# Patient Record
Sex: Male | Born: 1980 | Race: White | Hispanic: No | Marital: Single | Smoking: Current every day smoker
Health system: Southern US, Community
[De-identification: ages and names within clinical notes are randomized; demographics above are authoritative.]

## PROBLEM LIST (undated history)

## (undated) DIAGNOSIS — F419 Anxiety disorder, unspecified: Secondary | ICD-10-CM

## (undated) DIAGNOSIS — A549 Gonococcal infection, unspecified: Secondary | ICD-10-CM

## (undated) DIAGNOSIS — R51 Headache: Secondary | ICD-10-CM

## (undated) DIAGNOSIS — I4891 Unspecified atrial fibrillation: Secondary | ICD-10-CM

## (undated) DIAGNOSIS — R519 Headache, unspecified: Secondary | ICD-10-CM

## (undated) DIAGNOSIS — F909 Attention-deficit hyperactivity disorder, unspecified type: Secondary | ICD-10-CM

## (undated) DIAGNOSIS — J9383 Other pneumothorax: Secondary | ICD-10-CM

## (undated) DIAGNOSIS — N200 Calculus of kidney: Secondary | ICD-10-CM

## (undated) DIAGNOSIS — K219 Gastro-esophageal reflux disease without esophagitis: Secondary | ICD-10-CM

## (undated) HISTORY — PX: PERCUTANEOUS PINNING PHALANX FRACTURE OF HAND: SUR1027

---
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
## 2001-12-30 ENCOUNTER — Emergency Department (HOSPITAL_COMMUNITY): Admission: EM | Admit: 2001-12-30 | Discharge: 2001-12-30 | Payer: Self-pay

## 2002-06-17 ENCOUNTER — Emergency Department (HOSPITAL_COMMUNITY): Admission: EM | Admit: 2002-06-17 | Discharge: 2002-06-17 | Payer: Self-pay | Admitting: Emergency Medicine

## 2003-04-18 ENCOUNTER — Emergency Department (HOSPITAL_COMMUNITY): Admission: EM | Admit: 2003-04-18 | Discharge: 2003-04-18 | Payer: Self-pay | Admitting: Emergency Medicine

## 2009-05-16 ENCOUNTER — Emergency Department (HOSPITAL_COMMUNITY): Admission: EM | Admit: 2009-05-16 | Discharge: 2009-05-16 | Payer: Self-pay | Admitting: Emergency Medicine

## 2009-06-20 ENCOUNTER — Ambulatory Visit: Payer: Self-pay | Admitting: Internal Medicine

## 2009-06-20 ENCOUNTER — Emergency Department (HOSPITAL_COMMUNITY): Admission: EM | Admit: 2009-06-20 | Discharge: 2009-06-20 | Payer: Self-pay | Admitting: Emergency Medicine

## 2009-07-06 ENCOUNTER — Ambulatory Visit (HOSPITAL_COMMUNITY): Admission: EM | Admit: 2009-07-06 | Discharge: 2009-07-07 | Payer: Self-pay | Admitting: Emergency Medicine

## 2009-07-26 DIAGNOSIS — I4891 Unspecified atrial fibrillation: Secondary | ICD-10-CM | POA: Insufficient documentation

## 2009-07-27 ENCOUNTER — Encounter (INDEPENDENT_AMBULATORY_CARE_PROVIDER_SITE_OTHER): Payer: Self-pay | Admitting: *Deleted

## 2009-07-30 ENCOUNTER — Encounter: Admission: RE | Admit: 2009-07-30 | Discharge: 2009-08-22 | Payer: Self-pay | Admitting: Plastic Surgery

## 2009-08-21 ENCOUNTER — Ambulatory Visit (HOSPITAL_COMMUNITY): Admission: RE | Admit: 2009-08-21 | Discharge: 2009-08-21 | Payer: Self-pay | Admitting: Plastic Surgery

## 2010-06-04 NOTE — Letter (Signed)
Summary: Appointment - Missed  [facility]     [place], [place]    Phone:   Fax:      July 27, 2009 MRN: [id number]   [facility] [address] [place], [place]  [address]   Dear Mr. [facility],  Our records indicate you missed your appointment on 07-27-2009   with  Dr.Bensimhon   It is very important that we reach you to reschedule this appointment. We look forward to participating in your health care needs. Please contact us at the number listed above at your earliest convenience to reschedule this appointment.     Sincerely,   Lorne Skeens  [facility] Scheduling Team

## 2010-07-24 LAB — COMPREHENSIVE METABOLIC PANEL
Albumin: 3.8 g/dL (ref 3.5–5.2)
BUN: 9 mg/dL (ref 6–23)
CO2: 26 mEq/L (ref 19–32)
Calcium: 9.1 mg/dL (ref 8.4–10.5)
Chloride: 105 mEq/L (ref 96–112)
GFR calc non Af Amer: >60 mL/min (ref >60)
Total Protein: 7.3 g/dL (ref 6.0–8.3)

## 2010-07-24 LAB — DIFFERENTIAL
Basophils Absolute: 0 10*3/uL (ref 0.0–0.1)
Eosinophils Absolute: 0.1 10*3/uL (ref 0.0–0.7)
Eosinophils Relative: 2 % (ref 0–5)
Lymphocytes Relative: 33 % (ref 12–46)
Neutrophils Relative %: 59 % (ref 43–77)

## 2010-07-24 LAB — CBC
HCT: 47.3 % (ref 39.0–52.0)
Hemoglobin: 16.2 g/dL (ref 13.0–17.0)
Platelets: 178 10*3/uL (ref 150–400)
WBC: 5.7 10*3/uL (ref 4.0–10.5)

## 2010-07-24 LAB — TSH: TSH: 1.483 u[IU]/mL (ref 0.350–4.500)

## 2010-07-28 LAB — POCT I-STAT 4, (NA,K, GLUC, HGB,HCT)
Glucose, Bld: 83 mg/dL (ref 70–99)
HCT: 41 % (ref 39.0–52.0)
Hemoglobin: 13.9 g/dL (ref 13.0–17.0)
Potassium: 3.5 meq/L (ref 3.5–5.1)
Sodium: 141 meq/L (ref 135–145)

## 2011-01-02 ENCOUNTER — Emergency Department (HOSPITAL_COMMUNITY): Payer: Self-pay

## 2011-01-02 ENCOUNTER — Emergency Department (HOSPITAL_COMMUNITY)
Admission: EM | Admit: 2011-01-02 | Discharge: 2011-01-02 | Disposition: A | Discharge status: Home / Self Care | Payer: Self-pay | Attending: Emergency Medicine | Admitting: Emergency Medicine

## 2011-01-02 DIAGNOSIS — R112 Nausea with vomiting, unspecified: Secondary | ICD-10-CM | POA: Insufficient documentation

## 2011-01-02 DIAGNOSIS — N201 Calculus of ureter: Secondary | ICD-10-CM | POA: Insufficient documentation

## 2011-01-02 DIAGNOSIS — R109 Unspecified abdominal pain: Secondary | ICD-10-CM | POA: Insufficient documentation

## 2011-01-02 LAB — URINALYSIS, ROUTINE W REFLEX MICROSCOPIC
Leukocytes, UA: NEGATIVE
Specific Gravity, Urine: 1.017 (ref 1.005–1.030)
pH: 8 (ref 5.0–8.0)

## 2011-01-02 LAB — POCT I-STAT, CHEM 8
BUN: 6 mg/dL (ref 6–23)
Calcium, Ion: 1.1 mmol/L — ABNORMAL LOW (ref 1.12–1.32)
Creatinine, Ser: 1.1 mg/dL (ref 0.50–1.35)
HCT: 45 % (ref 39.0–52.0)
Hemoglobin: 15.3 g/dL (ref 13.0–17.0)

## 2011-01-02 LAB — URINE MICROSCOPIC-ADD ON

## 2011-05-12 ENCOUNTER — Emergency Department (INDEPENDENT_AMBULATORY_CARE_PROVIDER_SITE_OTHER)
Admission: EM | Admit: 2011-05-12 | Discharge: 2011-05-12 | Disposition: A | Discharge status: Home / Self Care | Payer: Self-pay | Source: Home / Self Care | Attending: Emergency Medicine | Admitting: Emergency Medicine

## 2011-05-12 DIAGNOSIS — N342 Other urethritis: Secondary | ICD-10-CM

## 2011-05-12 HISTORY — DX: Calculus of kidney: N20.0

## 2011-05-12 LAB — HIV ANTIBODY (ROUTINE TESTING W REFLEX): HIV: NONREACTIVE

## 2011-05-12 MED ORDER — AZITHROMYCIN 250 MG PO TABS
1000.0000 mg | ORAL_TABLET | Freq: Once | ORAL | Status: AC
  Administered 2011-05-12: 1000 mg via ORAL

## 2011-05-12 MED ORDER — CEFTRIAXONE SODIUM 250 MG IJ SOLR
250.0000 mg | Freq: Once | INTRAMUSCULAR | Status: AC
  Administered 2011-05-12: 250 mg via INTRAMUSCULAR

## 2011-05-12 MED ORDER — AZITHROMYCIN 250 MG PO TABS
ORAL_TABLET | ORAL | Status: AC
  Filled 2011-05-12: qty 4

## 2011-05-12 MED ORDER — CEFTRIAXONE SODIUM 250 MG IJ SOLR
INTRAMUSCULAR | Status: AC
  Filled 2011-05-12: qty 250

## 2011-05-12 MED ORDER — LIDOCAINE HCL (PF) 1 % IJ SOLN
INTRAMUSCULAR | Status: AC
  Filled 2011-05-12: qty 5

## 2011-05-12 NOTE — ED Provider Notes (Signed)
History     CSN: [id number]  Arrival date & time 05/12/11  1026   First MD Initiated Contact with Patient 05/12/11 1107      Chief Complaint  Patient presents with  . Exposure to STD    (Consider location/radiation/quality/duration/timing/severity/associated sxs/prior treatment) HPI Comments: Pt with 1 week of nonoderous yellow penile discharge, dysuria. C/o painless small "bumps" on tip of penis.   No urgency, frequency,  oderous urine, hematuria, penile itching. No fevers, N/V, abd pain, back pain. No recent abx use. Pt had unprotected intercourse with new male partner prior to sx starting. Does not know if she is sxatic.. STD's a concern today. No h/o gonorrhea chlamydia trichmonoas. No h/o syphilis, herpes, HIV.    Patient is a 31 y.o. male presenting with STD exposure. The history is provided by the patient.  Exposure to STD This is a new problem. The current episode started more than 1 week ago. The problem occurs constantly. Pertinent negatives include no chest pain and no abdominal pain. The symptoms are relieved by nothing. He has tried nothing for the symptoms.    Past Medical History  Diagnosis Date  . Kidney stone     Past Surgical History  Procedure Date  . Hand surgery     History reviewed. No pertinent family history.  History  Substance Use Topics  . Smoking status: Current Everyday Smoker  . Smokeless tobacco: Not on file  . Alcohol Use: Yes      Review of Systems  Constitutional: Negative for fever.  HENT: Negative for sore throat.   Cardiovascular: Negative for chest pain.  Gastrointestinal: Negative for nausea, vomiting and abdominal pain.  Genitourinary: Positive for dysuria, discharge, genital sores and penile pain. Negative for urgency, frequency, hematuria, penile swelling, scrotal swelling and testicular pain.  Musculoskeletal: Negative for arthralgias.  Skin: Negative for rash.  Neurological: Negative for weakness.    Allergies    Review of patient's allergies indicates no known allergies.  Home Medications  No current outpatient prescriptions on file.  BP 137/63  Pulse 62  Temp(Src) 98 F (36.7 C) (Oral)  Resp 18  SpO2 100%  Physical Exam  Nursing note and vitals reviewed. Constitutional: He is oriented to person, place, and time. He appears well-developed and well-nourished.  HENT:  Head: Normocephalic and atraumatic.  Eyes: Conjunctivae and EOM are normal.  Neck: Normal range of motion. Neck supple.  Cardiovascular: Regular rhythm.   Pulmonary/Chest: Effort normal. No respiratory distress.  Abdominal: He exhibits no distension.  Genitourinary: Testes normal. Cremasteric reflex is present. Circumcised. No penile erythema or penile tenderness. Discharge found.       Yellowish watery penile d/c. Painless small papules on corona of penis. no other rash. Pt declined chaperone.   Musculoskeletal: Normal range of motion. He exhibits no edema and no tenderness.  Lymphadenopathy:    He has no cervical adenopathy.       Right: Inguinal adenopathy present.       Left: Inguinal adenopathy present.  Neurological: He is alert and oriented to person, place, and time.  Skin: Skin is warm and dry. No rash noted.  Psychiatric: He has a normal mood and affect. His behavior is normal.    ED Course  Procedures (including critical care time)   Labs Reviewed  GC/CHLAMYDIA PROBE AMP, GENITAL  RPR  HIV ANTIBODY (ROUTINE TESTING)   No results found.   1. Urethritis       MDM  H&P most c/w gonorrhea chlamydia. Painless ulcers  do not think that this is herpes. Sent off GC/chlamydia,  HIV, RPR. Will treat empirically now. Giving ceftriaxone 250 mg IM/azithro 1 gm po. . Advised pt to refrain from sexual contact until he knows lab results, symptoms resolve, and partner(s) are treated. Pt knows he has to come in for HIV results. Pt provided working phone number. Pt agrees.      Luiz Blare, MD 05/12/11  [phone]

## 2011-05-12 NOTE — ED Notes (Signed)
C/o penile discharge, burning with urination, pain with erection and penis painful to touch.  Sx for 1 week.

## 2011-05-14 LAB — GC/CHLAMYDIA PROBE AMP, GENITAL: Chlamydia, DNA Probe: POSITIVE — AB

## 2011-05-15 ENCOUNTER — Telehealth (HOSPITAL_COMMUNITY): Payer: Self-pay | Admitting: *Deleted

## 2011-05-15 NOTE — ED Notes (Signed)
GC pos., Chlamydia pos. Pt. adequately treated with Rocephin 250 mg. IM and Zithromax.  Pt. given results and instructions ( see telephone encounter).  DHHS forms x 2 completed and faxed to the [facility]. Vassie Moselle 05/15/2011

## 2011-06-24 ENCOUNTER — Emergency Department (INDEPENDENT_AMBULATORY_CARE_PROVIDER_SITE_OTHER)
Admission: EM | Admit: 2011-06-24 | Discharge: 2011-06-24 | Disposition: A | Discharge status: Home / Self Care | Payer: Self-pay | Source: Home / Self Care | Attending: Family Medicine | Admitting: Family Medicine

## 2011-06-24 ENCOUNTER — Encounter (HOSPITAL_COMMUNITY): Payer: Self-pay | Admitting: Emergency Medicine

## 2011-06-24 DIAGNOSIS — A64 Unspecified sexually transmitted disease: Secondary | ICD-10-CM

## 2011-06-24 MED ORDER — LIDOCAINE HCL (PF) 1 % IJ SOLN
INTRAMUSCULAR | Status: AC
  Filled 2011-06-24: qty 5

## 2011-06-24 MED ORDER — AZITHROMYCIN 250 MG PO TABS
1000.0000 mg | ORAL_TABLET | Freq: Once | ORAL | Status: AC
  Administered 2011-06-24: 1000 mg via ORAL

## 2011-06-24 MED ORDER — CEFTRIAXONE SODIUM 250 MG IJ SOLR
250.0000 mg | Freq: Once | INTRAMUSCULAR | Status: AC
  Administered 2011-06-24: 250 mg via INTRAMUSCULAR

## 2011-06-24 MED ORDER — CEFTRIAXONE SODIUM 250 MG IJ SOLR
INTRAMUSCULAR | Status: AC
  Filled 2011-06-24: qty 250

## 2011-06-24 MED ORDER — AZITHROMYCIN 250 MG PO TABS
ORAL_TABLET | ORAL | Status: AC
  Filled 2011-06-24: qty 4

## 2011-06-24 NOTE — ED Provider Notes (Signed)
History     CSN: [id number]  Arrival date & time 06/24/11  1048   First MD Initiated Contact with Patient 06/24/11 1145      Chief Complaint  Patient presents with  . Exposure to STD    (Consider location/radiation/quality/duration/timing/severity/associated sxs/prior treatment) HPI Comments: Dylan Hoffman presents for evaluation of penile discharge, dysuria, and painful erection. He reports onset of symptoms 3 days ago. He reports a similar episode 3-4 weeks ago, when he and his girlfriend were both treated for gonorrhea and chlamydia. He reports resolution of symptoms after the first round of treatment. He now questions her fidelity.  Patient is a 31 y.o. male presenting with penile discharge. The history is provided by the patient.  Penile Discharge This is a new problem. The current episode started more than 2 days ago. The problem occurs constantly. The problem has not changed since onset.The symptoms are aggravated by intercourse. The symptoms are relieved by nothing.    Past Medical History  Diagnosis Date  . Kidney stone     Past Surgical History  Procedure Date  . Hand surgery     History reviewed. No pertinent family history.  History  Substance Use Topics  . Smoking status: Current Everyday Smoker  . Smokeless tobacco: Not on file  . Alcohol Use: Yes      Review of Systems  Constitutional: Negative.   HENT: Negative.   Eyes: Negative.   Respiratory: Negative.   Cardiovascular: Negative.   Gastrointestinal: Negative.   Genitourinary: Positive for dysuria and discharge. Negative for scrotal swelling, genital sores and testicular pain.  Musculoskeletal: Negative.   Skin: Negative.   Neurological: Negative.     Allergies  Review of patient's allergies indicates no known allergies.  Home Medications  No current outpatient prescriptions on file.  BP 116/68  Pulse 62  Temp(Src) 98.4 F (36.9 C) (Oral)  Resp 14  SpO2 98%  Physical Exam  Nursing note  and vitals reviewed. Constitutional: He is oriented to person, place, and time. He appears well-developed and well-nourished.  HENT:  Head: Normocephalic and atraumatic.  Eyes: EOM are normal.  Neck: Normal range of motion.  Pulmonary/Chest: Effort normal.  Genitourinary: Testes normal. Circumcised. Discharge found.  Musculoskeletal: Normal range of motion.  Neurological: He is alert and oriented to person, place, and time.  Skin: Skin is warm and dry.  Psychiatric: His behavior is normal.    ED Course  Procedures (including critical care time)   Labs Reviewed  GC/CHLAMYDIA PROBE AMP, GENITAL   No results found.   1. STD (male)       MDM  Treated with ceftriaxone 250 mg IM x 1 and azithromycin 1 gm PO x 1        Richardo Priest, MD 06/24/11 1309

## 2011-06-24 NOTE — ED Notes (Signed)
Offered soda and crackers, declined.

## 2011-06-24 NOTE — ED Notes (Signed)
Patient reports penile burning with urination, painful erection, and penile discharge.

## 2011-06-24 NOTE — Discharge Instructions (Signed)
We have treated you for urethritis; this includes gonococcal as well as non-gonococcal urethritis. We have also treated you for chlamydial infection as well. Return to care should your symptoms not improve, or worsen in any way.

## 2011-06-25 LAB — GC/CHLAMYDIA PROBE AMP, GENITAL: GC Probe Amp, Genital: POSITIVE — AB

## 2011-06-26 ENCOUNTER — Telehealth (HOSPITAL_COMMUNITY): Payer: Self-pay | Admitting: *Deleted

## 2011-06-26 NOTE — ED Notes (Signed)
GC pos., Chlamydia neg. Pt. adequately treated with Rocephin and Zithromax. I called pt. and verified x 2. Pt. given results and told he was adequately treated. Pt. instructed to notify his partner to be treated, no sex for 1 week and to practice safe sex. He can get HIV testing at the [facility] STD clinic.  DHHS form completed and faxed to the [facility]. Vassie Moselle 06/26/2011

## 2011-11-30 ENCOUNTER — Encounter (HOSPITAL_COMMUNITY): Payer: Self-pay | Admitting: *Deleted

## 2011-11-30 ENCOUNTER — Emergency Department (HOSPITAL_COMMUNITY): Payer: Self-pay

## 2011-11-30 ENCOUNTER — Inpatient Hospital Stay (HOSPITAL_COMMUNITY)
Admission: EM | Admit: 2011-11-30 | Discharge: 2011-12-02 | DRG: 153 | Disposition: A | Discharge status: Home / Self Care | Payer: Self-pay | Attending: Internal Medicine | Admitting: Internal Medicine

## 2011-11-30 DIAGNOSIS — D72829 Elevated white blood cell count, unspecified: Secondary | ICD-10-CM | POA: Diagnosis present

## 2011-11-30 DIAGNOSIS — R221 Localized swelling, mass and lump, neck: Secondary | ICD-10-CM

## 2011-11-30 DIAGNOSIS — I4891 Unspecified atrial fibrillation: Secondary | ICD-10-CM

## 2011-11-30 DIAGNOSIS — J029 Acute pharyngitis, unspecified: Principal | ICD-10-CM

## 2011-11-30 DIAGNOSIS — K029 Dental caries, unspecified: Secondary | ICD-10-CM | POA: Diagnosis present

## 2011-11-30 DIAGNOSIS — K006 Disturbances in tooth eruption: Secondary | ICD-10-CM | POA: Diagnosis present

## 2011-11-30 DIAGNOSIS — F17200 Nicotine dependence, unspecified, uncomplicated: Secondary | ICD-10-CM | POA: Diagnosis present

## 2011-11-30 DIAGNOSIS — J329 Chronic sinusitis, unspecified: Secondary | ICD-10-CM

## 2011-11-30 DIAGNOSIS — R220 Localized swelling, mass and lump, head: Secondary | ICD-10-CM

## 2011-11-30 DIAGNOSIS — R591 Generalized enlarged lymph nodes: Secondary | ICD-10-CM

## 2011-11-30 DIAGNOSIS — L0291 Cutaneous abscess, unspecified: Secondary | ICD-10-CM

## 2011-11-30 DIAGNOSIS — L0390 Cellulitis, unspecified: Secondary | ICD-10-CM

## 2011-11-30 LAB — CBC WITH DIFFERENTIAL/PLATELET
Eosinophils Relative: 0 % (ref 0–5)
HCT: 44.2 % (ref 39.0–52.0)
Hemoglobin: 15.3 g/dL (ref 13.0–17.0)
Lymphocytes Relative: 14 % (ref 12–46)
Lymphs Abs: 1.3 10*3/uL (ref 0.7–4.0)
MCV: 91.9 fL (ref 78.0–100.0)
Monocytes Absolute: 0.6 10*3/uL (ref 0.1–1.0)
Monocytes Relative: 6 % (ref 3–12)
RBC: 4.81 MIL/uL (ref 4.22–5.81)
WBC: 9.8 10*3/uL (ref 4.0–10.5)

## 2011-11-30 LAB — COMPREHENSIVE METABOLIC PANEL
ALT: 11 U/L (ref 0–53)
CO2: 26 mEq/L (ref 19–32)
Calcium: 9.6 mg/dL (ref 8.4–10.5)
Chloride: 98 mEq/L (ref 96–112)
Creatinine, Ser: 0.78 mg/dL (ref 0.50–1.35)
GFR calc Af Amer: >90 mL/min (ref >90)
GFR calc non Af Amer: >90 mL/min (ref >90)
Glucose, Bld: 93 mg/dL (ref 70–99)
Total Bilirubin: 1.1 mg/dL (ref 0.3–1.2)

## 2011-11-30 MED ORDER — DEXAMETHASONE SODIUM PHOSPHATE 10 MG/ML IJ SOLN
10.0000 mg | Freq: Three times a day (TID) | INTRAMUSCULAR | Status: DC
  Administered 2011-11-30 – 2011-12-01 (×2): 10 mg via INTRAVENOUS
  Filled 2011-11-30 (×5): qty 1

## 2011-11-30 MED ORDER — SODIUM CHLORIDE 0.9 % IJ SOLN
3.0000 mL | Freq: Two times a day (BID) | INTRAMUSCULAR | Status: DC
  Administered 2011-11-30 – 2011-12-01 (×2): 3 mL via INTRAVENOUS

## 2011-11-30 MED ORDER — CHLORHEXIDINE GLUCONATE 0.12 % MT SOLN
15.0000 mL | Freq: Four times a day (QID) | OROMUCOSAL | Status: DC
  Administered 2011-11-30 – 2011-12-02 (×6): 15 mL via OROMUCOSAL
  Filled 2011-11-30 (×10): qty 15

## 2011-11-30 MED ORDER — HYDROCODONE-ACETAMINOPHEN 5-325 MG PO TABS
1.0000 | ORAL_TABLET | ORAL | Status: DC | PRN
  Administered 2011-12-01 (×3): 2 via ORAL
  Filled 2011-11-30 (×4): qty 2

## 2011-11-30 MED ORDER — IOHEXOL 300 MG/ML  SOLN
80.0000 mL | Freq: Once | INTRAMUSCULAR | Status: AC | PRN
  Administered 2011-11-30: 80 mL via INTRAVENOUS

## 2011-11-30 MED ORDER — SODIUM CHLORIDE 0.9 % IV SOLN
INTRAVENOUS | Status: AC
  Administered 2011-11-30: 22:00:00 via INTRAVENOUS

## 2011-11-30 MED ORDER — HYDROMORPHONE HCL PF 1 MG/ML IJ SOLN
1.0000 mg | INTRAMUSCULAR | Status: AC | PRN
  Administered 2011-11-30: 1 mg via INTRAVENOUS
  Filled 2011-11-30 (×3): qty 1

## 2011-11-30 MED ORDER — SODIUM CHLORIDE 0.9 % IJ SOLN: 3.0000 mL | INTRAMUSCULAR | Status: DC | PRN

## 2011-11-30 MED ORDER — SODIUM CHLORIDE 0.9 % IV BOLUS (SEPSIS)
1000.0000 mL | Freq: Once | INTRAVENOUS | Status: AC
  Administered 2011-11-30: 1000 mL via INTRAVENOUS

## 2011-11-30 MED ORDER — BACID PO TABS
2.0000 | ORAL_TABLET | Freq: Three times a day (TID) | ORAL | Status: DC
  Administered 2011-11-30: 2 via ORAL
  Filled 2011-11-30 (×6): qty 2

## 2011-11-30 MED ORDER — CLINDAMYCIN PHOSPHATE 300 MG/50ML IV SOLN
300.0000 mg | Freq: Four times a day (QID) | INTRAVENOUS | Status: DC
  Administered 2011-11-30 – 2011-12-02 (×6): 300 mg via INTRAVENOUS
  Filled 2011-11-30 (×8): qty 50

## 2011-11-30 MED ORDER — ONDANSETRON HCL 4 MG/2ML IJ SOLN
4.0000 mg | Freq: Three times a day (TID) | INTRAMUSCULAR | Status: AC | PRN
  Administered 2011-11-30: 4 mg via INTRAVENOUS
  Filled 2011-11-30: qty 2

## 2011-11-30 MED ORDER — HYDROMORPHONE HCL PF 1 MG/ML IJ SOLN
1.0000 mg | Freq: Once | INTRAMUSCULAR | Status: AC
  Administered 2011-11-30: 1 mg via INTRAVENOUS
  Filled 2011-11-30: qty 1

## 2011-11-30 MED ORDER — DOXYCYCLINE HYCLATE 100 MG IV SOLR
100.0000 mg | Freq: Two times a day (BID) | INTRAVENOUS | Status: DC
  Administered 2011-11-30 – 2011-12-01 (×2): 100 mg via INTRAVENOUS
  Filled 2011-11-30 (×3): qty 100

## 2011-11-30 MED ORDER — HYDROMORPHONE HCL PF 1 MG/ML IJ SOLN
1.0000 mg | INTRAMUSCULAR | Status: DC | PRN
  Administered 2011-12-01 – 2011-12-02 (×4): 1 mg via INTRAVENOUS
  Filled 2011-11-30 (×2): qty 1

## 2011-11-30 MED ORDER — SODIUM CHLORIDE 0.9 % IV SOLN: 250.0000 mL | INTRAVENOUS | Status: DC | PRN

## 2011-11-30 NOTE — Consult Note (Signed)
Joffre, Lucks [id number] 05/29/1980 Gerhard Munch, MD  Reason for Consult: trismus, sinusitis, left neck lymphadenitis and phlegmon  HPI: 31 yo who has had several days of worsening trismus and left neck pain and swelling. He has had increasing difficulty swallowing his saliva or eating/drinking. I reviewed his Neck CT with contrast, this shows bulky left greater than right bilateral lymphadenopathy with dental caries and impacted third molars and a hypodense left parapharyngeal/left level II neck process extending down to the left supraglottic larynx and hypopharynx consistent with a phlegmon without drainable abscess. He also has left maxillary Lund-McKay grade I sinusitis.  Allergies: No Known Allergies  ROS: + left neck pain and swelling, + trismus, + difficulty handling secretions, otherwise negative x 10 systems except per HPI.  PMH:  Past Medical History  Diagnosis Date  . Kidney stone     FH: History reviewed. No pertinent family history.  SH:  History   Social History  . Marital Status: Single    Spouse Name: N/A    Number of Children: N/A  . Years of Education: N/A   Occupational History  . Not on file.   Social History Main Topics  . Smoking status: Current Everyday Smoker  . Smokeless tobacco: Not on file  . Alcohol Use: Yes  . Drug Use: Yes    Special: Marijuana  . Sexually Active:    Other Topics Concern  . Not on file   Social History Narrative  . No narrative on file    PSH:  Past Surgical History  Procedure Date  . Hand surgery     Physical  Exam: CN 2-12 grossly intact and symmetric. EAC/TMs normal BL. He has trismus to 2cm. The tongue is mobile and normal. The uvula is midline with some mild/minimal edema. The tonsils are 1-2+. Skin warm and dry. Nasal cavity without polyps or purulence. External nose and ears without masses or lesions. EOMI, PERRLA. Neck is tender to palpation and erythematous and edematous on the left. There is bilateral  tender lymphadenopathy palpated. Thyroid normal with no palpable masses. Trachea is midline. No stridor or stertor when lying flat.   A/P: left maxillary sinusitis and pharyngitis with phlegmon of the left neck, pharynx, and floor of mouth. I would recommend admission to medicine/hospitalist service with IV Decadron and IV antibiotics. I have written for Clindamycin and Doxycycline to double cover anaerobes, dental flora, and staph/strep. Monitor for clinical improvement and when trismus and neck swelling have improved would transition to PO Doxycycline and clindamycin for total of 14 days with a PO steroid taper. My suspicion for malignancy is low but he can follow up with me after discharge for re-exam and surveillance of the lymphadenopathy and outpatient FNA if this persists.   Melvenia Beam 11/30/2011 8:08 PM

## 2011-11-30 NOTE — H&P (Signed)
Chief Complaint:  Jaw pain  HPI: 31 yo healthy male comes in with 4 days of progressive worsening left lower jaw pain and neck swelling.  Also with sore throat.  Hard and painful to open mouth.  Denies fevers but has been having chills.  No rashes.  No drainage from ears.  ent already seen pt and recommended hosp for iv abx and steriods.  Pt has not been on any recent abx.  Review of Systems:  O/w neg  Past Medical History: Past Medical History  Diagnosis Date  . Kidney stone    Past Surgical History  Procedure Date  . Hand surgery     Medications: Prior to Admission medications   Not on File    Allergies:  No Known Allergies  Social History:  reports that he has been smoking.  He does not have any smokeless tobacco history on file. He reports that he drinks alcohol. He reports that he uses illicit drugs (Marijuana).  Family History: History reviewed. No pertinent family history.  Physical Exam: Filed Vitals:   11/30/11 1549 11/30/11 1756  BP: 132/75 123/77  Pulse: 71 65  Temp: 98.7 F (37.1 C)   TempSrc: Oral   Resp: 16 18  SpO2: 100% 96%   General appearance: alert, cooperative and no distress Neck: supple, symmetrical, trachea midline and thyroid not enlarged, symmetric, no tenderness/mass/nodules mild swelling left submand area, no cellultiis,  Lungs: clear to auscultation bilaterally Heart: regular rate and rhythm, S1, S2 normal, no murmur, click, rub or gallop Abdomen: soft, non-tender; bowel sounds normal; no masses,  no organomegaly Extremities: extremities normal, atraumatic, no cyanosis or edema Pulses: 2+ and symmetric Skin: Skin color, texture, turgor normal. No rashes or lesions Neurologic: Grossly normal    Labs on Admission:   [facility] 11/30/11 1702  NA 136  K 4.0  CL 98  CO2 26  GLUCOSE 93  BUN 11  CREATININE 0.78  CALCIUM 9.6  MG --  PHOS --    Basename 11/30/11 1702  AST 17  ALT 11  ALKPHOS 48  BILITOT 1.1  PROT 7.7    ALBUMIN 4.2    Basename 11/30/11 1702  WBC 9.8  NEUTROABS 7.8*  HGB 15.3  HCT 44.2  MCV 91.9  PLT 151   Radiological Exams on Admission: Ct Soft Tissue Neck W Contrast  11/30/2011  *RADIOLOGY REPORT*  Clinical Data: Left-sided facial pain and swelling.  CT NECK WITH CONTRAST  Technique:  Multidetector CT imaging of the neck was performed with intravenous contrast.  Contrast: 80mL OMNIPAQUE IOHEXOL 300 MG/ML  SOLN  Comparison: None.  Findings: The visualized portion of the brain appears normal. Examination the skull base demonstrates left maxillary sinus disease.  The mastoid air cells and middle ear cavities are clear.  There is bulky the submandibular and left-sided cervical lymphadenopathy along with a diffuse phlegmonous inflammatory change in the submandibular region and also  tracking down along the anterior aspect of the left neck superficial to the anterior strap muscles.  I do not see a definite discrete abscess.  No obvious dental abscess.  Dental caries are noted.  Impacted wisdom teeth are noted.  No mass or abscess in the floor the mouth.  There is mildly prominent lymphoid tissue at the base the tongue. The epiglottis is normal.  The aryepiglottic folds are normal.  The piriform sinus and vallecular air spaces are normal.  The region of the true cords is normal.  The major vascular structures are patent.  The  thyroid gland is normal.  Small supraclavicular lymph nodes are noted.  The lung apices are clear.  IMPRESSION:  1.  Bulky left-sided submandibular and cervical region lymphadenopathy with a diffuse inflammatory/phlegmonous process involving the left side of the neck without discrete abscess. Findings most likely due to lymphadenitis but recommend ENT consultation to exclude underlying malignancy. 2.  Dental caries and impacted wisdom teeth but no definite bone abscess.  No abscess in the floor of mouth or tonsillar regions.  Original Report Authenticated By: P. Loralie Champagne, M.D.     Assessment/Plan Present on Admission:  31 yo male with left sided neck swelling, pharyngitis, sinusitis no obv absess .Facial swelling .Sinusitis .Pharyngitis  obs for iv doxy/clinda/steroids.  ent following.  Likely d/c in 24 hours or so.  Iv and po pain meds prn.  Edmundo Tedesco [phone] 11/30/2011, 8:30 PM

## 2011-11-30 NOTE — ED Notes (Signed)
PA at bedside.

## 2011-11-30 NOTE — ED Provider Notes (Signed)
History    Scribed for Gerhard Munch, MD, the patient was seen in room TR08C/TR08C. This chart was scribed by Katha Cabal.   CSN: [id number]  Arrival date & time 11/30/11  1530   First MD Initiated Contact with Patient 11/30/11 1614      Chief Complaint  Patient presents with  . Dental Pain    (Consider location/radiation/quality/duration/timing/severity/associated sxs/prior treatment) HPI Gerhard Munch, MD entered patient's room at 4:52 PM   Dylan Hoffman is a 31 y.o. male who presents to the Emergency Department complaining of persistence of constant lower left dental pain for past 3 days.  Pain radiates from eye to neck.  Symptoms are associated with trismus, difficulty swallowing, chills, nausea, night sweats and neck discomfort.  Symptoms are not associated with fever, chest pain, abdominal pain or disorientation. Ibuprofen taken for pain with short term relief.          Past Medical History  Diagnosis Date  . Kidney stone     Past Surgical History  Procedure Date  . Hand surgery     History reviewed. No pertinent family history.  History  Substance Use Topics  . Smoking status: Current Everyday Smoker  . Smokeless tobacco: Not on file  . Alcohol Use: Yes      Review of Systems  All other systems reviewed and are negative.   Remaining review of systems negative except as noted in the HPI.   Allergies  Review of patient's allergies indicates no known allergies.  Home Medications  No current outpatient prescriptions on file.  BP 132/75  Pulse 71  Temp 98.7 F (37.1 C) (Oral)  Resp 16  SpO2 100%  Physical Exam  Nursing note and vitals reviewed. Constitutional: He is oriented to person, place, and time. He appears well-developed. No distress.  HENT:  Head: Normocephalic and atraumatic. There is trismus in the jaw.  Mouth/Throat: Oropharynx is clear and moist.       partial lower left wisdom tooth eruption, good dentition, no signs of  infection appreciated   Eyes: Conjunctivae and EOM are normal.  Cardiovascular: Normal rate and regular rhythm.   Pulmonary/Chest: Effort normal. No stridor. No respiratory distress.  Abdominal: He exhibits no distension.  Musculoskeletal: He exhibits no edema.  Neurological: He is alert and oriented to person, place, and time.  Skin: Skin is warm and dry.  Psychiatric: He has a normal mood and affect.    ED Course  Procedures (including critical care time)     COORDINATION OF CARE: 4:56 PM  Physical exam complete.  Will CT neck soft tissue with contrast.   5:00 PM  IV fluids and Dilaudid ordered.      LABS / RADIOLOGY:    Labs Reviewed  CBC WITH DIFFERENTIAL  COMPREHENSIVE METABOLIC PANEL   No results found.       MDM       MEDICATIONS GIVEN IN THE E.D. Scheduled Meds:    .  HYDROmorphone (DILAUDID) injection  1 mg Intravenous Once  . sodium chloride  1,000 mL Intravenous Once   Continuous Infusions:      IMPRESSION: No diagnosis found.   NEW MEDICATIONS: New Prescriptions   No medications on file    I personally performed the services described in this documentation, which was scribed in my presence. The recorded information has been reviewed and considered.  This previously well male presents with new left facial pain and trismus.  Given the patient's trismus, discomfort, there is suspicion of peritonsillar,  or other abscess.  The patient was transferred to the CDU for the completion of his care.    Gerhard Munch, MD 12/10/11 1549

## 2011-11-30 NOTE — ED Provider Notes (Signed)
6:08 PM Pt is moved to the CDU after initial evaluation by EDP Jeraldine Loots; presented to ED with c/c left lower dental pain. Trismus on exam with no obvious infection seen initially. Labs and CT neck were ordered for further evaluation. On my exam, patient is alert and oriented, in no acute distress. He is able to open his mouth only about 1 cm. Cervical and submandibular lymphadenopathy noted on the left. Lungs are clear to auscultation bilaterally. Heart regular rate and rhythm.  Labs are reviewed and the patient has no leukocytosis, unremarkable metabolic panel. CBC pending. We'll continue to monitor.  7:36 PM CT scan results have been reviewed, discussed with EDP. ENT will be consulted.   7:50 PM ENT (Dr Emeline Darling) evaluated the patient's imaging study, spoke with attending physician. Will place steroid and antibiotic orders and requests a medicine admission. [facility] is paged. Patient is updated on this plan and voices understanding.  Shaaron Adler, PA-C 12/01/11 0000

## 2011-11-30 NOTE — ED Notes (Signed)
Reports having dental abscess left lower, having severe pain and mild swelling. Airway is intact.

## 2011-12-01 ENCOUNTER — Observation Stay (HOSPITAL_COMMUNITY): Payer: Self-pay

## 2011-12-01 DIAGNOSIS — R599 Enlarged lymph nodes, unspecified: Secondary | ICD-10-CM

## 2011-12-01 MED ORDER — NICOTINE 21 MG/24HR TD PT24
21.0000 mg | MEDICATED_PATCH | Freq: Every day | TRANSDERMAL | Status: DC
  Administered 2011-12-01 – 2011-12-02 (×2): 21 mg via TRANSDERMAL
  Filled 2011-12-01 (×2): qty 1

## 2011-12-01 MED ORDER — RISAQUAD PO CAPS
2.0000 | ORAL_CAPSULE | Freq: Three times a day (TID) | ORAL | Status: DC
Start: 2011-12-01 — End: 2011-12-01
  Filled 2011-12-01: qty 2

## 2011-12-01 MED ORDER — RISAQUAD PO CAPS
2.0000 | ORAL_CAPSULE | Freq: Three times a day (TID) | ORAL | Status: DC
  Administered 2011-12-01 – 2011-12-02 (×2): 2 via ORAL
  Filled 2011-12-01 (×4): qty 2

## 2011-12-01 MED ORDER — LACTINEX PO CHEW
2.0000 | CHEWABLE_TABLET | Freq: Three times a day (TID) | ORAL | Status: DC
  Filled 2011-12-01: qty 2

## 2011-12-01 NOTE — ED Provider Notes (Signed)
Please see my initial note.  This young M was placed in CDU pending CT.  I reviewed the results, and discussed them with Dr. Emeline Darling (ENT).  We agreed to start the patient on abx, and admit him to the hospitalists w ENT following.  Gerhard Munch, MD 12/01/11 [phone]

## 2011-12-01 NOTE — Progress Notes (Signed)
Nutrition Brief Note  Patient identified on the Nutrition Risk Report for problems chewing or swallowing. Pt reports he is not able to open his mouth completely so is only able to take in small items. Pt states it has been improving since admission with medication for pain. This problem has been going on for 2 days now. Does not have a problem chewing his food once he gets it in his mouth.   RD does not expect this to significantly impact the pt's weight or nutritional status.    Body mass index is 19.87 kg/(m^2). Pt meets criteria for WNL based on current BMI.   Current diet order is clear liquids, patient is consuming approximately 100% of meals at this time. Labs and medications reviewed.   No nutrition interventions warranted at this time. If nutrition issues arise, please re-consult RD.   Clarene Duke RD, LDN Pager [phone] After Hours pager [phone]

## 2011-12-01 NOTE — Progress Notes (Signed)
[facility] PROGRESS NOTE  Dylan Hoffman ZOX:[id number] DOB: Sep 13, 1980 DOA: 11/30/2011   Assessment/Plan: Patient Active Hospital Problem List: Facial swelling (11/30/2011)   -currently have a leukocytosis, has remained afebrile. He relates the swelling in his neck and mouth is decreased. We'll get an orthopantogram of his mouth to rule out an abscess. We'll continue on the clindamycin DC steroids.  Sinusitis (11/30/2011) -no headaches no complain of some sinus.    Code Status: Full Family Communication: Patient and family Disposition Plan: To be determined     LOS: 1 day   Procedures:    Antibiotics:  Clindamycin 11/22/2011    Subjective: He relates he feels much better. He is able to swallow today. And actually tolerating his diet. He relates no further jaw pain.  Objective: Filed Vitals:   11/30/11 1756 11/30/11 2055 11/30/11 2228 12/01/11 0502  BP: 123/77 121/83 123/77 129/74  Pulse: 65 61 66 63  Temp:   98.9 F (37.2 C) 97.9 F (36.6 C)  TempSrc:   Oral Oral  Resp: 18 18 18 18   Height:   6\' 2"  (1.88 m)   Weight:   70.2 kg (154 lb 12.2 oz)   SpO2: 96% 97% 99% 100%    Intake/Output Summary (Last 24 hours) at 12/01/11 1043 Last data filed at 12/01/11 0900  Gross per 24 hour  Intake 1971.25 ml  Output      0 ml  Net 1971.25 ml   Weight change:   Exam:  General: Alert, awake, oriented x3, in no acute distress.  HEENT: No bruits, no goiter. His neck is swollen on the left side tender lymph nodes and tender lower posterior maxilla Heart: Regular rate and rhythm, without murmurs, rubs, gallops.  Lungs: Good air movement, bilateral air movement.  Abdomen: Soft, nontender, nondistended, positive bowel sounds.  Neuro: Grossly intact, nonfocal.   Data Reviewed: Basic Metabolic Panel:  Lab 11/30/11 8119  NA 136  K 4.0  CL 98  CO2 26  GLUCOSE 93  BUN 11  CREATININE 0.78  CALCIUM 9.6  MG --  PHOS --   Liver Function Tests:  Lab  11/30/11 1702  AST 17  ALT 11  ALKPHOS 48  BILITOT 1.1  PROT 7.7  ALBUMIN 4.2   No results found for this basename: LIPASE:5,AMYLASE:5 in the last 168 hours No results found for this basename: AMMONIA:5 in the last 168 hours CBC:  Lab 11/30/11 1702  WBC 9.8  NEUTROABS 7.8*  HGB 15.3  HCT 44.2  MCV 91.9  PLT 151   Cardiac Enzymes: No results found for this basename: CKTOTAL:5,CKMB:5,CKMBINDEX:5,TROPONINI:5 in the last 168 hours BNP: No components found with this basename: POCBNP:5 CBG: No results found for this basename: GLUCAP:5 in the last 168 hours  No results found for this or any previous visit (from the past 240 hour(s)).   Studies: Ct Soft Tissue Neck W Contrast  11/30/2011  *RADIOLOGY REPORT*  Clinical Data: Left-sided facial pain and swelling.  CT NECK WITH CONTRAST  Technique:  Multidetector CT imaging of the neck was performed with intravenous contrast.  Contrast: 80mL OMNIPAQUE IOHEXOL 300 MG/ML  SOLN  Comparison: None.  Findings: The visualized portion of the brain appears normal. Examination the skull base demonstrates left maxillary sinus disease.  The mastoid air cells and middle ear cavities are clear.  There is bulky the submandibular and left-sided cervical lymphadenopathy along with a diffuse phlegmonous inflammatory change in the submandibular region and also  tracking down along the anterior aspect  of the left neck superficial to the anterior strap muscles.  I do not see a definite discrete abscess.  No obvious dental abscess.  Dental caries are noted.  Impacted wisdom teeth are noted.  No mass or abscess in the floor the mouth.  There is mildly prominent lymphoid tissue at the base the tongue. The epiglottis is normal.  The aryepiglottic folds are normal.  The piriform sinus and vallecular air spaces are normal.  The region of the true cords is normal.  The major vascular structures are patent.  The thyroid gland is normal.  Small supraclavicular lymph nodes are  noted.  The lung apices are clear.  IMPRESSION:  1.  Bulky left-sided submandibular and cervical region lymphadenopathy with a diffuse inflammatory/phlegmonous process involving the left side of the neck without discrete abscess. Findings most likely due to lymphadenitis but recommend ENT consultation to exclude underlying malignancy. 2.  Dental caries and impacted wisdom teeth but no definite bone abscess.  No abscess in the floor of mouth or tonsillar regions.  Original Report Authenticated By: P. Loralie Champagne, M.D.    Scheduled Meds:   . sodium chloride   Intravenous STAT  . chlorhexidine  15 mL Mouth/Throat QID  . clindamycin (CLEOCIN) IV  300 mg Intravenous Q6H  . dexamethasone  10 mg Intravenous Q8H  . doxycycline (VIBRAMYCIN) IV  100 mg Intravenous Q12H  .  HYDROmorphone (DILAUDID) injection  1 mg Intravenous Once  . lactobacillus acidophilus  2 tablet Oral TID  . sodium chloride  1,000 mL Intravenous Once  . sodium chloride  3 mL Intravenous Q12H   Continuous Infusions:   Lambert Keto, MD  [facility] Pager [phone]  If 7PM-7AM, please contact night-coverage [email or web address] Password [facility] 12/01/2011, 10:43 AM

## 2011-12-01 NOTE — Progress Notes (Signed)
Utilization review complete 

## 2011-12-02 MED ORDER — HYDROCODONE-ACETAMINOPHEN 10-325 MG PO TABS: 1.0000 | ORAL_TABLET | Freq: Four times a day (QID) | ORAL | Status: AC | PRN

## 2011-12-02 MED ORDER — CLINDAMYCIN HCL 150 MG PO CAPS: 450.0000 mg | ORAL_CAPSULE | Freq: Three times a day (TID) | ORAL | Status: DC

## 2011-12-02 MED ORDER — CLINDAMYCIN HCL 150 MG PO CAPS: 450.0000 mg | ORAL_CAPSULE | Freq: Three times a day (TID) | ORAL | Status: AC

## 2011-12-02 NOTE — Discharge Summary (Signed)
Physician Discharge Summary  Dylan Hoffman WJX:[id number] DOB: July 11, 1980 DOA: 11/30/2011  PCP: Sheila Oats, MD  Admit date: 11/30/2011 Discharge date: 12/02/2011  Discharge Diagnoses:  Principal Problem:  *Facial swelling Active Problems:  Pharyngitis   Discharge Condition: Stable at the time of discharge  Disposition:  Follow-up Information    Follow up with Florene Route on 12/03/2011. (10:20  [address], please bring all paper work from hospitalization alone with xrays and ct)    Contact information:   [address] [facility] [place] [address] [phone]          Diet: Continue soft diet and advance as tolerated Wt Readings from Last 3 Encounters:  11/30/11 70.2 kg (154 lb 12.2 oz)    History of present illness:  31 yo healthy male comes in with 4 days of progressive worsening left lower jaw pain and neck swelling. Also with sore throat. Hard and painful to open mouth. Denies fevers but has been having chills. No rashes. No drainage from ears. ent already seen pt and recommended hosp for iv abx and steriods. Pt has not been on any recent abx.   Hospital Course:  Principal Problem:  *Facial swelling secondary to molar cavity and impacted right lower wisdom tooth: -The patient was admitted to the hospital because difficulty swallowing. To the point where he was not able to swallow his own screen shows. He was started on empiric antibiotic and a short course of steroids. Next day the patient's swelling was significantly decreased he was able to eat starting with a clear liquid diet. Which has been advanced. He remained afebrile blood cultures continue to be negative. CT scan of the face was done which showed results as above, orthopantogram was done and showed results as above. We'll continue empiric treatment and a followup appointment was set with Dr. Carollee Massed as an outpatient for further intervention.  Discharge Exam: Filed  Vitals:   12/02/11 0551  BP: 117/68  Pulse: 48  Temp: 97.6 F (36.4 C)  Resp: 20   Filed Vitals:   12/01/11 0502 12/01/11 1414 12/01/11 2220 12/02/11 0551  BP: 129/74 108/67 117/66 117/68  Pulse: 63 57 60 48  Temp: 97.9 F (36.6 C) 98.2 F (36.8 C) 98 F (36.7 C) 97.6 F (36.4 C)  TempSrc: Oral Oral Oral Oral  Resp: 18 18 18 20   Height:      Weight:      SpO2: 100% 99% 100% 100%   General: Awake alert and oriented x3 in no acute distress Cardiovascular: Regular rate and rhythm with positive S1 and S2 Respiratory: Good air movement clear to auscultation  Discharge Instructions  Discharge Orders    Future Orders Please Complete By Expires   Diet - low sodium heart healthy      Diet - low sodium heart healthy      Increase activity slowly      Increase activity slowly        Medication List  As of 12/02/2011 10:25 AM   TAKE these medications         clindamycin 150 MG capsule   Commonly known as: CLEOCIN   Take 3 capsules (450 mg total) by mouth 3 (three) times daily.      HYDROcodone-acetaminophen 10-325 MG per tablet   Commonly known as: NORCO   Take 1 tablet by mouth every 6 (six) hours as needed for pain.  The results of significant diagnostics from this hospitalization (including imaging, microbiology, ancillary and laboratory) are listed below for reference.    Significant Diagnostic Studies: Dg Orthopantogram  12/01/2011  *RADIOLOGY REPORT*  Clinical Data: Left posterior upper and lower tooth pain.  ORTHOPANTOGRAM/PANORAMIC  Comparison: None.  Findings: Impacted right lower wisdom tooth.  Moderately large cavity in the left lower second molar.  No other cavities are seen and no periapical lucencies are demonstrated.  IMPRESSION:  1.  Cavity in the left lower second molar. 2.  Impacted right lower wisdom tooth.  Original Report Authenticated By: Darrol Angel, M.D.   Ct Soft Tissue Neck W Contrast  11/30/2011  *RADIOLOGY REPORT*  Clinical  Data: Left-sided facial pain and swelling.  CT NECK WITH CONTRAST  Technique:  Multidetector CT imaging of the neck was performed with intravenous contrast.  Contrast: 80mL OMNIPAQUE IOHEXOL 300 MG/ML  SOLN  Comparison: None.  Findings: The visualized portion of the brain appears normal. Examination the skull base demonstrates left maxillary sinus disease.  The mastoid air cells and middle ear cavities are clear.  There is bulky the submandibular and left-sided cervical lymphadenopathy along with a diffuse phlegmonous inflammatory change in the submandibular region and also  tracking down along the anterior aspect of the left neck superficial to the anterior strap muscles.  I do not see a definite discrete abscess.  No obvious dental abscess.  Dental caries are noted.  Impacted wisdom teeth are noted.  No mass or abscess in the floor the mouth.  There is mildly prominent lymphoid tissue at the base the tongue. The epiglottis is normal.  The aryepiglottic folds are normal.  The piriform sinus and vallecular air spaces are normal.  The region of the true cords is normal.  The major vascular structures are patent.  The thyroid gland is normal.  Small supraclavicular lymph nodes are noted.  The lung apices are clear.  IMPRESSION:  1.  Bulky left-sided submandibular and cervical region lymphadenopathy with a diffuse inflammatory/phlegmonous process involving the left side of the neck without discrete abscess. Findings most likely due to lymphadenitis but recommend ENT consultation to exclude underlying malignancy. 2.  Dental caries and impacted wisdom teeth but no definite bone abscess.  No abscess in the floor of mouth or tonsillar regions.  Original Report Authenticated By: P. Loralie Champagne, M.D.    Microbiology: No results found for this or any previous visit (from the past 240 hour(s)).   Labs: Basic Metabolic Panel:  Lab 11/30/11 4540  NA 136  K 4.0  CL 98  CO2 26  GLUCOSE 93  BUN 11  CREATININE  0.78  CALCIUM 9.6  MG --  PHOS --   Liver Function Tests:  Lab 11/30/11 1702  AST 17  ALT 11  ALKPHOS 48  BILITOT 1.1  PROT 7.7  ALBUMIN 4.2   No results found for this basename: LIPASE:5,AMYLASE:5 in the last 168 hours No results found for this basename: AMMONIA:5 in the last 168 hours CBC:  Lab 11/30/11 1702  WBC 9.8  NEUTROABS 7.8*  HGB 15.3  HCT 44.2  MCV 91.9  PLT 151   Cardiac Enzymes: No results found for this basename: CKTOTAL:5,CKMB:5,CKMBINDEX:5,TROPONINI:5 in the last 168 hours BNP: No components found with this basename: POCBNP:5 CBG: No results found for this basename: GLUCAP:5 in the last 168 hours  Time coordinating discharge: Greater than 30 minutes  Signed:  Marinda Elk  [facility] 12/02/2011, 10:25 AM

## 2011-12-02 NOTE — Care Management Note (Signed)
    Page 1 of 1   12/02/2011     11:00:30 AM   CARE MANAGEMENT NOTE 12/02/2011  Patient:  Dylan Hoffman, Dylan Hoffman   Account Number:  [id number]  Date Initiated:  12/02/2011  Documentation initiated by:  Letha Cape  Subjective/Objective Assessment:   dx facial swelling  admit- lives with parents.  pta independent.     Action/Plan:   Anticipated DC Date:  12/02/2011   Anticipated DC Plan:  HOME/SELF CARE      DC Planning Services  CM consult      Choice offered to / List presented to:             Status of service:  Completed, signed off Medicare Important Message given?   (If response is "NO", the following Medicare IM given date fields will be blank) Date Medicare IM given:   Date Additional Medicare IM given:    Discharge Disposition:  HOME/SELF CARE  Per UR Regulation:  Reviewed for med. necessity/level of care/duration of stay  If discussed at Long Length of Stay Meetings, dates discussed:    Comments:  12/02/11 10:58 Letha Cape RN, BSN [phone] patient lives with parents, pta independent.  Patient has no insurance, patient is eligilbe for med ast , patient has transportation.  Patient states will need help with abx. Sent abx down to main pharmacy , they will call unit when meds are ready.

## 2012-01-14 ENCOUNTER — Emergency Department (HOSPITAL_COMMUNITY)
Admission: EM | Admit: 2012-01-14 | Discharge: 2012-01-14 | Disposition: A | Discharge status: Home / Self Care | Payer: Self-pay | Attending: Emergency Medicine | Admitting: Emergency Medicine

## 2012-01-14 ENCOUNTER — Encounter (HOSPITAL_COMMUNITY): Payer: Self-pay | Admitting: Emergency Medicine

## 2012-01-14 DIAGNOSIS — F17200 Nicotine dependence, unspecified, uncomplicated: Secondary | ICD-10-CM | POA: Insufficient documentation

## 2012-01-14 DIAGNOSIS — I4891 Unspecified atrial fibrillation: Secondary | ICD-10-CM | POA: Diagnosis present

## 2012-01-14 HISTORY — DX: Unspecified atrial fibrillation: I48.91

## 2012-01-14 HISTORY — DX: Anxiety disorder, unspecified: F41.9

## 2012-01-14 HISTORY — DX: Gonococcal infection, unspecified: A54.9

## 2012-01-14 LAB — POCT I-STAT, CHEM 8
BUN: 15 mg/dL (ref 6–23)
Chloride: 102 mEq/L (ref 96–112)
Creatinine, Ser: 1 mg/dL (ref 0.50–1.35)
Sodium: 142 mEq/L (ref 135–145)

## 2012-01-14 LAB — POCT I-STAT TROPONIN I: Troponin i, poc: 0 ng/mL (ref 0.00–0.08)

## 2012-01-14 LAB — MAGNESIUM: Magnesium: 2.1 mg/dL (ref 1.5–2.5)

## 2012-01-14 MED ORDER — METOPROLOL TARTRATE 1 MG/ML IV SOLN
5.0000 mg | INTRAVENOUS | Status: DC | PRN
  Administered 2012-01-14: 5 mg via INTRAVENOUS
  Filled 2012-01-14: qty 5

## 2012-01-14 MED ORDER — PROPOFOL 10 MG/ML IV EMUL
INTRAVENOUS | Status: AC
  Filled 2012-01-14: qty 100

## 2012-01-14 MED ORDER — KETAMINE HCL 10 MG/ML IJ SOLN
1.0000 mg/kg | Freq: Once | INTRAMUSCULAR | Status: AC
  Administered 2012-01-14: 70 mg via INTRAVENOUS
  Filled 2012-01-14: qty 7

## 2012-01-14 MED ORDER — PROPOFOL 10 MG/ML IV BOLUS
0.5000 mg/kg | Freq: Once | INTRAVENOUS | Status: AC
  Administered 2012-01-14: 35 mg via INTRAVENOUS

## 2012-01-14 MED ORDER — POTASSIUM CHLORIDE 10 MEQ/100ML IV SOLN
10.0000 meq | Freq: Once | INTRAVENOUS | Status: AC
  Administered 2012-01-14: 10 meq via INTRAVENOUS
  Filled 2012-01-14: qty 100

## 2012-01-14 NOTE — ED Notes (Signed)
PT. SIGNED CONSENT FORM FOR CARDIOVERSION . CRASH CART AT BEDSIDE / RESPIRATORY THERAPIST NOTIFIED.

## 2012-01-14 NOTE — ED Notes (Signed)
PT. ALERT AND ORIENTED , DENIES ANY PAIN OR DISCOMFORT , RESPIRATIONS UNLABORED , DR. Radford Pax SPOKE WITH PT. ON DISCHARGE PLAN .

## 2012-01-14 NOTE — ED Notes (Signed)
New EKG given to Dr Radford Pax

## 2012-01-14 NOTE — ED Notes (Signed)
ALERT AND ORIENTED , RESPIRATIONS UNLABORED , IV SITE UNREMARKABLE , PT. CALLED FAMILY FOR TRANSPORTATION BACK HOME .

## 2012-01-14 NOTE — Consult Note (Signed)
[facility] History and Physical  DEVAUNTE GASPARINI EAV:[id number] DOB: 29-Jun-1980 DOA: 01/14/2012  Referring physician: Radford Pax PCP: Sheila Oats, MD   Chief Complaint: Afib  HPI: NIL Dylan Hoffman is a 31 y.o. male  26 yr with h/o P Afib in 06/2009 chemically cardioverted with Flecained was fine until 01:00 am 9/11 am when he was eating [employer] and c[employer] and fish sticks and started feeling an irregular beat in his chest.  He started to feel his heart skipping, no dizzyness, no sncope.  He waitd for a little while to see if they would go away, so he came to the ED. This happneed in 2011-but has not happened since.   Drinks muscle monster shakes which apparently contains 156 mg of Caffeine. Smokes Tobacco about 1/2 ppd as well as occ marijuana   Review of Systems: Tno cp/sob/n/v/blurre d/double vision   Past Medical History  Diagnosis Date  . Kidney stone   . Anxiety    Past Surgical History  Procedure Date  . Hand surgery   . Hand surgery    Social History:  reports that he has been smoking Cigarettes.  He has been smoking about 1 pack per day. He does not have any smokeless tobacco history on file. He reports that he uses illicit drugs (Marijuana). He reports that he does not drink alcohol.  History   Social History  . Marital Status: Single    Spouse Name: N/A    Number of Children: N/A  . Years of Education: N/A   Occupational History  . Not on file.   Social History Main Topics  . Smoking status: Current Every Day Smoker -- 1.0 packs/day    Types: Cigarettes  . Smokeless tobacco: Not on file  . Alcohol Use: No  . Drug Use: Yes    Special: Marijuana  . Sexually Active:    Other Topics Concern  . 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   Prior to Admission medications   Not on File   Physical Exam: Filed Vitals:   01/14/12 0204 01/14/12 0404  BP:  128/66 105/72  Pulse: 120 92  Temp: 97.9 F (36.6 C)   TempSrc: Oral   Resp: 18 14  SpO2: 97% 97%     General:  Alert, oriented CM in NAd  Eyes: no pallorict  ENT: nad  Neck: soft, nt/Nt  Cardiovascular:  s1 s2 irreg,irreg  Respiratory: cta b  Abdomen: soft, nt/nd  Skin: tattos on neck, back, forearms  Musculoskeletal: normal  Psychiatric: euthymic  Labs on Admission:  Basic Metabolic Panel:  Lab 01/14/12 7829  NA 142  K 3.2*  CL 102  CO2 --  GLUCOSE 131*  BUN 15  CREATININE 1.00  CALCIUM --  MG --  PHOS --   Liver Function Tests: No results found for this basename: AST:5,ALT:5,ALKPHOS:5,BILITOT:5,PROT:5,ALBUMIN:5 in the last 168 hours No results found for this basename: LIPASE:5,AMYLASE:5 in the last 168 hours No results found for this basename: AMMONIA:5 in the last 168 hours CBC:  Lab 01/14/12 0339  WBC --  NEUTROABS --  HGB 14.3  HCT 42.0  MCV --  PLT --   Cardiac Enzymes: No results found for this basename: CKTOTAL:5,CKMB:5,CKMBINDEX:5,TROPONINI:5 in the last 168 hours  BNP (last 3 results) No results found for this basename: PROBNP:3 in the last 8760 hours  CBG: No results found for this basename: GLUCAP:5 in the last 168 hours  Radiological Exams on Admission: No results found.  EKG: Independently reviewed. Afib c RVR, , QRS axis=0 degrees, J point elevation V4-V6, likely rate related P wave peak.  Assessment/Plan Active Problems:  ATRIAL FIBRILLATION c RVR   1. Case discussed c Dr. 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 Radford Pax and this broke his Afib--Will forward this note to Dr.  Milas Kocher who has een him in the past.  Code Status: FUll Family Communication: none at bedside  Disposition Plan: d/c home with close Cards f/u-thank you for the interesting consult  Time spent: 1 hour   Mahala Menghini [facility] Pager [phone]  If 7PM-7AM, please contact night-coverage [email or web address] Password [facility] 01/14/2012, 5:17 AM

## 2012-01-14 NOTE — ED Notes (Signed)
PT. RESPONSIVE TO VERBAL STIMULI , RESTING AT THIS TIME ,, RESPIRATIONS UNLABORED , IV SITE UNREMARKABLE , HR= 68 NSR.

## 2012-01-14 NOTE — ED Notes (Signed)
PT. CARDIOVERTED AT 50 JOULES , RECEIVED PROPOFOL 70 MG/KETAMINE 70 MG MIXTURE AS SEDATION , CONVERTED TO NSR.  DR. Radford Pax AT BEDSIDE DURING PROCEDURE.

## 2012-01-14 NOTE — ED Notes (Signed)
PT. REPORTS PALPITATIONS ONSET THIS EVENING WITH SLIGHT CHEST TIGHTNESS , DENIES SOB .

## 2012-01-14 NOTE — ED Provider Notes (Signed)
History     CSN: [id number]  Arrival date & time 01/14/12  0145   First MD Initiated Contact with Patient 01/14/12 0259      Chief Complaint  Patient presents with  . Palpitations     HPI Patient complains of palpitations started around 2-3 hours ago.  Patient's had similar episodes in the past with a history of atrial fibrillation.  Patient states the last time he came in here and they gave him some medicine and sent him home.  Patient's not seen a cardiologist for this complaint.  Patient denies any history of chest pain.  Denies drug abuse other than occasional marijuana.  Patient denies any other medical conditions and is on no current regular medications. Past Medical History  Diagnosis Date  . Kidney stone   . Anxiety   . Gonorrhea   . Afib     Past Surgical History  Procedure Date  . Hand surgery   . Hand surgery     Family History  Problem Relation Age of Onset  . Family history unknown: Yes    History  Substance Use Topics  . Smoking status: Current Every Day Smoker -- 1.0 packs/day    Types: Cigarettes  . Smokeless tobacco: Not on file  . Alcohol Use: No      Review of Systems  All other systems reviewed and are negative.    Allergies  Review of patient's allergies indicates no known allergies.  Home Medications  No current outpatient prescriptions on file.  BP 125/83  Pulse 72  Temp 97.9 F (36.6 C) (Oral)  Resp 14  Wt 154 lb 5.2 oz (70 kg)  SpO2 98%  Physical Exam  Nursing note and vitals reviewed. Constitutional: He is oriented to person, place, and time. He appears well-developed. No distress.  HENT:  Head: Normocephalic and atraumatic.  Eyes: Pupils are equal, round, and reactive to light.  Neck: Normal range of motion.  Cardiovascular: Normal rate and intact distal pulses.  An irregularly irregular rhythm present.       Atrial fibrillation Rate = 101 No significant ST-T changes No significant PVCs.  Pulmonary/Chest: No  respiratory distress.  Abdominal: Normal appearance. He exhibits no distension.  Musculoskeletal: Normal range of motion.  Neurological: He is alert and oriented to person, place, and time. No cranial nerve deficit.  Skin: Skin is warm and dry. No rash noted.  Psychiatric: He has a normal mood and affect. His behavior is normal.    ED Course  CARDIOVERSION Date/Time: 01/14/2012 6:20 AM Performed by: Nelia Shi Authorized by: Nelia Shi Consent: Verbal consent obtained. Written consent obtained. Risks and benefits: risks, benefits and alternatives were discussed Consent given by: patient Patient understanding: patient states understanding of the procedure being performed Patient identity confirmed: verbally with patient and arm band Time out: Immediately prior to procedure a "time out" was called to verify the correct patient, procedure, equipment, support staff and site/side marked as required. Patient sedated: yes Sedation type: moderate (conscious) sedation Sedatives: ketamine and propofol Vitals: Vital signs were monitored during sedation. Cardioversion basis: elective Indications: failure of anti-arrhythmic medications Pre-procedure rhythm: atrial fibrillation Patient position: patient was placed in a supine position Chest area: chest area exposed Electrodes: pads Electrodes placed: anterior-posterior Number of attempts: 1 Attempt 1 mode: synchronous Attempt 1 waveform: biphasic Attempt 1 shock (in Joules): 50 Attempt 1 outcome: conversion to normal sinus rhythm Post-procedure rhythm: normal sinus rhythm Complications: no complications Patient tolerance: Patient tolerated the procedure well  with no immediate complications.  Procedural sedation Performed by: Nelia Shi Authorized by: Nelia Shi Consent: Verbal consent obtained. Risks and benefits: risks, benefits and alternatives were discussed Consent given by: patient Patient understanding:  patient states understanding of the procedure being performed Patient identity confirmed: verbally with patient Time out: Immediately prior to procedure a "time out" was called to verify the correct patient, procedure, equipment, support staff and site/side marked as required. Patient sedated: yes Sedation type: moderate (conscious) sedation Sedatives: etomidate and ketamine Vitals: Vital signs were monitored during sedation. Patient tolerance: Patient tolerated the procedure well with no immediate complications.   (including critical care time)  Labs Reviewed  POCT I-STAT, CHEM 8 - Abnormal; Notable for the following:    Potassium 3.2 (*)     Glucose, Bld 131 (*)     All other components within normal limits  POCT I-STAT TROPONIN I  MAGNESIUM   No results found.   1. Atrial fibrillation       MDM          Nelia Shi, MD 01/14/12 [phone]

## 2012-06-19 ENCOUNTER — Emergency Department (HOSPITAL_COMMUNITY): Payer: Self-pay

## 2012-06-19 ENCOUNTER — Observation Stay (HOSPITAL_COMMUNITY)
Admission: EM | Admit: 2012-06-19 | Discharge: 2012-06-20 | Disposition: A | Discharge status: Home / Self Care | Payer: Self-pay | Attending: Cardiovascular Disease | Admitting: Cardiovascular Disease

## 2012-06-19 ENCOUNTER — Encounter (HOSPITAL_COMMUNITY): Payer: Self-pay | Admitting: *Deleted

## 2012-06-19 DIAGNOSIS — R42 Dizziness and giddiness: Secondary | ICD-10-CM | POA: Insufficient documentation

## 2012-06-19 DIAGNOSIS — R0602 Shortness of breath: Secondary | ICD-10-CM | POA: Insufficient documentation

## 2012-06-19 DIAGNOSIS — F411 Generalized anxiety disorder: Secondary | ICD-10-CM | POA: Insufficient documentation

## 2012-06-19 DIAGNOSIS — E876 Hypokalemia: Secondary | ICD-10-CM | POA: Insufficient documentation

## 2012-06-19 DIAGNOSIS — I4891 Unspecified atrial fibrillation: Principal | ICD-10-CM | POA: Insufficient documentation

## 2012-06-19 DIAGNOSIS — F17200 Nicotine dependence, unspecified, uncomplicated: Secondary | ICD-10-CM | POA: Insufficient documentation

## 2012-06-19 LAB — CBC WITH DIFFERENTIAL/PLATELET
Eosinophils Absolute: 0.2 10*3/uL (ref 0.0–0.7)
Lymphocytes Relative: 46 % (ref 12–46)
Lymphs Abs: 2.6 10*3/uL (ref 0.7–4.0)
Neutro Abs: 2.3 10*3/uL (ref 1.7–7.7)
Neutrophils Relative %: 42 % — ABNORMAL LOW (ref 43–77)
Platelets: 195 10*3/uL (ref 150–400)
RBC: 4.94 MIL/uL (ref 4.22–5.81)
WBC: 5.5 10*3/uL (ref 4.0–10.5)

## 2012-06-19 LAB — CBC
MCH: 30.7 pg (ref 26.0–34.0)
MCV: 88.6 fL (ref 78.0–100.0)
Platelets: 165 10*3/uL (ref 150–400)
RBC: 4.63 MIL/uL (ref 4.22–5.81)
RDW: 12.7 % (ref 11.5–15.5)
WBC: 5.9 10*3/uL (ref 4.0–10.5)

## 2012-06-19 LAB — HEPARIN LEVEL (UNFRACTIONATED)
Heparin Unfractionated: 0.72 IU/mL — ABNORMAL HIGH (ref 0.30–0.70)
Heparin Unfractionated: 0.65 IU/mL (ref 0.30–0.70)

## 2012-06-19 LAB — POCT I-STAT, CHEM 8
Chloride: 103 mEq/L (ref 96–112)
Creatinine, Ser: 0.9 mg/dL (ref 0.50–1.35)
Hemoglobin: 15.3 g/dL (ref 13.0–17.0)
Potassium: 3.2 mEq/L — ABNORMAL LOW (ref 3.5–5.1)
Sodium: 139 mEq/L (ref 135–145)

## 2012-06-19 LAB — CREATININE, SERUM: Creatinine, Ser: 0.8 mg/dL (ref 0.50–1.35)

## 2012-06-19 LAB — POCT I-STAT TROPONIN I: Troponin i, poc: 0.01 ng/mL (ref 0.00–0.08)

## 2012-06-19 MED ORDER — HEPARIN (PORCINE) IN NACL 100-0.45 UNIT/ML-% IJ SOLN
1200.0000 [IU]/h | INTRAMUSCULAR | Status: DC
  Administered 2012-06-19: 1300 [IU]/h via INTRAVENOUS
  Administered 2012-06-20: 1200 [IU]/h via INTRAVENOUS
  Filled 2012-06-19 (×3): qty 250

## 2012-06-19 MED ORDER — ASPIRIN EC 81 MG PO TBEC
81.0000 mg | DELAYED_RELEASE_TABLET | Freq: Every day | ORAL | Status: DC
  Administered 2012-06-19 – 2012-06-20 (×2): 81 mg via ORAL
  Filled 2012-06-19 (×2): qty 1

## 2012-06-19 MED ORDER — ONDANSETRON HCL 4 MG/2ML IJ SOLN: 4.0000 mg | Freq: Four times a day (QID) | INTRAMUSCULAR | Status: DC | PRN

## 2012-06-19 MED ORDER — ONDANSETRON HCL 4 MG PO TABS: 4.0000 mg | ORAL_TABLET | Freq: Four times a day (QID) | ORAL | Status: DC | PRN

## 2012-06-19 MED ORDER — DIGOXIN 0.25 MG/ML IJ SOLN
0.2500 mg | Freq: Every day | INTRAMUSCULAR | Status: AC
  Administered 2012-06-19: 0.25 mg via INTRAVENOUS
  Filled 2012-06-19: qty 1

## 2012-06-19 MED ORDER — DILTIAZEM HCL 25 MG/5ML IV SOLN
10.0000 mg | Freq: Once | INTRAVENOUS | Status: AC
  Administered 2012-06-19: 10 mg via INTRAVENOUS
  Filled 2012-06-19: qty 5

## 2012-06-19 MED ORDER — HEPARIN BOLUS VIA INFUSION
5000.0000 [IU] | Freq: Once | INTRAVENOUS | Status: AC
  Administered 2012-06-19: 5000 [IU] via INTRAVENOUS

## 2012-06-19 MED ORDER — HEPARIN SODIUM (PORCINE) 5000 UNIT/ML IJ SOLN: 5000.0000 [IU] | Freq: Three times a day (TID) | INTRAMUSCULAR | Status: DC

## 2012-06-19 MED ORDER — SODIUM CHLORIDE 0.9 % IV SOLN
INTRAVENOUS | Status: DC
  Administered 2012-06-19 (×2): via INTRAVENOUS

## 2012-06-19 MED ORDER — ALUM & MAG HYDROXIDE-SIMETH 200-200-20 MG/5ML PO SUSP: 30.0000 mL | Freq: Four times a day (QID) | ORAL | Status: DC | PRN

## 2012-06-19 MED ORDER — ACETAMINOPHEN 650 MG RE SUPP: 650.0000 mg | Freq: Four times a day (QID) | RECTAL | Status: DC | PRN

## 2012-06-19 MED ORDER — POTASSIUM CHLORIDE CRYS ER 20 MEQ PO TBCR
40.0000 meq | EXTENDED_RELEASE_TABLET | Freq: Once | ORAL | Status: AC
  Administered 2012-06-19: 40 meq via ORAL
  Filled 2012-06-19: qty 2

## 2012-06-19 MED ORDER — ACETAMINOPHEN 325 MG PO TABS: 650.0000 mg | ORAL_TABLET | Freq: Four times a day (QID) | ORAL | Status: DC | PRN

## 2012-06-19 MED ORDER — SODIUM CHLORIDE 0.9 % IV BOLUS (SEPSIS)
500.0000 mL | Freq: Once | INTRAVENOUS | Status: AC
  Administered 2012-06-19: 500 mL via INTRAVENOUS

## 2012-06-19 MED ORDER — SODIUM CHLORIDE 0.9 % IJ SOLN
3.0000 mL | Freq: Two times a day (BID) | INTRAMUSCULAR | Status: DC
  Administered 2012-06-19: 3 mL via INTRAVENOUS

## 2012-06-19 MED ORDER — DILTIAZEM HCL 30 MG PO TABS
30.0000 mg | ORAL_TABLET | Freq: Four times a day (QID) | ORAL | Status: DC
  Administered 2012-06-19 – 2012-06-20 (×5): 30 mg via ORAL
  Filled 2012-06-19 (×9): qty 1

## 2012-06-19 NOTE — Progress Notes (Signed)
  Echocardiogram 2D Echocardiogram has been performed.  Ellender Hose A 06/19/2012, 2:13 PM

## 2012-06-19 NOTE — H&P (Signed)
Dylan Hoffman is an 32 y.o. male.   Chief Complaint: Palpitation and shortness of breath. HPI: 32 years old male presented with palpitations and shortness of breath. No chest pain. + dizziness. No nausea, vomiting or sweating spell. Palpitation started post choking feeling and coughing about 4 in AM. Monitor in ED showed atrial fibrillation with RVR. Heart rate decreased with IV diltiazem use. Had similar episode in September but had no cardiology follow up.  Past Medical History  Diagnosis Date  . Kidney stone   . Anxiety   . Gonorrhea   . Afib       Past Surgical History  Procedure Laterality Date  . Hand surgery    . Hand surgery      History reviewed. No pertinent family history. Social History:  reports that he has been smoking Cigarettes.  He has been smoking about 1.00 pack per day. He does not have any smokeless tobacco history on file. He reports that he uses illicit drugs (Marijuana). He reports that he does not drink alcohol.  Allergies: No Known Allergies   (Not in a hospital admission)  Results for orders placed during the hospital encounter of 06/19/12 (from the past 48 hour(s))  CBC WITH DIFFERENTIAL     Status: Abnormal   Collection Time    06/19/12  5:49 AM      Result Value Range   WBC 5.5  4.0 - 10.5 K/uL   RBC 4.94  4.22 - 5.81 MIL/uL   Hemoglobin 15.5  13.0 - 17.0 g/dL   HCT 40.9  81.1 - 91.4 %   MCV 88.1  78.0 - 100.0 fL   MCH 31.4  26.0 - 34.0 pg   MCHC 35.6  30.0 - 36.0 g/dL   RDW 78.2  95.6 - 21.3 %   Platelets 195  150 - 400 K/uL   Neutrophils Relative 42 (*) 43 - 77 %   Neutro Abs 2.3  1.7 - 7.7 K/uL   Lymphocytes Relative 46  12 - 46 %   Lymphs Abs 2.6  0.7 - 4.0 K/uL   Monocytes Relative 8  3 - 12 %   Monocytes Absolute 0.4  0.1 - 1.0 K/uL   Eosinophils Relative 3  0 - 5 %   Eosinophils Absolute 0.2  0.0 - 0.7 K/uL   Basophils Relative 1  0 - 1 %   Basophils Absolute 0.0  0.0 - 0.1 K/uL  POCT I-STAT TROPONIN I     Status: None   Collection Time    06/19/12  5:53 AM      Result Value Range   Troponin i, poc 0.01  0.00 - 0.08 ng/mL   Comment 3            Comment: Due to the release kinetics of cTnI,     a negative result within the first hours     of the onset of symptoms does not rule out     myocardial infarction with certainty.     If myocardial infarction is still suspected,     repeat the test at appropriate intervals.  POCT I-STAT, CHEM 8     Status: Abnormal   Collection Time    06/19/12  5:55 AM      Result Value Range   Sodium 139  135 - 145 mEq/L   Potassium 3.2 (*) 3.5 - 5.1 mEq/L   Chloride 103  96 - 112 mEq/L   BUN 11  6 - 23 mg/dL  Creatinine, Ser 0.90  0.50 - 1.35 mg/dL   Glucose, Bld 161 (*) 70 - 99 mg/dL   Calcium, Ion 0.96  0.45 - 1.23 mmol/L   TCO2 25  0 - 100 mmol/L   Hemoglobin 15.3  13.0 - 17.0 g/dL   HCT 40.9  81.1 - 91.4 %   Dg Chest Port 1 View  06/19/2012  *RADIOLOGY REPORT*  Clinical Data: Palpitations; irregular heartbeat.  Shortness of breath.  PORTABLE CHEST - 1 VIEW  Comparison: None.  Findings: The lungs are mildly hyperexpanded and appear clear. There is no evidence of focal opacification, pleural effusion or pneumothorax.  The cardiomediastinal silhouette is within normal limits.  No acute osseous abnormalities are seen.  IMPRESSION: Mildly hyperexpanded and clear lungs.   Original Report Authenticated By: Tonia Ghent, M.D.     @ROS @ Review of Systems  Constitutional: Negative for diaphoresis.  HENT: Negative for congestion and neck pain.  Eyes: Negative for visual disturbance.  Respiratory: Positive for shortness of breath. Negative for cough.  Cardiovascular: Positive for palpitations. Negative for chest pain and leg swelling.  Gastrointestinal: Negative for nausea, vomiting and abdominal pain.  Genitourinary: Negative for dysuria.  Musculoskeletal: Negative for back pain.  Skin: Negative for rash.  Neurological: Negative for dizziness, syncope and  light-headedness.  Hematological: Does not bruise/bleed easily.   Blood pressure 95/65, pulse 103, temperature 98.7 F (37.1 C), temperature source Oral, resp. rate 18, SpO2 100.00%. Constitutional: He appears well-developed and well-nourished. No distress.  HENT: Head: Normocephalic and atraumatic. Eyes: Conjunctivae are normal. Pupils are equal, round, and reactive to light.  Neck: Normal range of motion. Neck supple.  Cardiovascular: Intact distal pulses. No murmur heard. Irregular tachycardic rhythm.  Pulmonary/Chest: Effort normal and breath sounds normal. No respiratory distress.   Abdominal: Soft. Bowel sounds are normal. There is no tenderness.  Musculoskeletal: Normal range of motion. He exhibits no edema and no tenderness.  Neurological: He is alert and oriented to person, place, and time. No cranial nerve deficit. He exhibits normal muscle tone. Coordination normal.  Skin: Skin is warm. No erythema.   EKG-Atrial fibrillation with rapid ventricular response.  Assessment/Plan Atrial fibrillation with RVR. Dizziness  Place in observation Consider TEE with cardioversion.  [name] 06/19/2012, 8:35 AM

## 2012-06-19 NOTE — ED Notes (Signed)
HR 78-103 afib

## 2012-06-19 NOTE — ED Notes (Signed)
Denies pain, resting comfortably, "still feels funny, irregular HR remains", VSS, sBP 99, NAD, calm, pending EDP eval.

## 2012-06-19 NOTE — ED Notes (Signed)
Woke at 0400 to get something to eat, "had difficulty swallowing food and felt that that triggered fast irregular HR at that time", also palpitations & mild sob, h/o same, "has been shocked out of rhythm in the past", no meds PTA or regularly, (denies: pain, syncope, dizziness, nv or other sx).

## 2012-06-19 NOTE — Progress Notes (Signed)
ANTICOAGULATION CONSULT NOTE - Initial Consult  Pharmacy Consult for Heparin Indication: atrial fibrillation  No Known Allergies  Patient Measurements: Height: 6\' 3"  (190.5 cm) Weight: 160 lb (72.576 kg) IBW/kg (Calculated) : 84.5 Heparin Dosing Weight: 84 kg Vital Signs: Temp: 98.7 F (37.1 C) (02/15 0620) Temp src: Oral (02/15 0620) BP: 99/62 mmHg (02/15 0836) Pulse Rate: 86 (02/15 0836)  Labs:  Recent Labs  06/19/12 0549 06/19/12 0555  HGB 15.5 15.3  HCT 43.5 45.0  PLT 195  --   CREATININE  --  0.90    The CrCl is unknown because both a height and weight (above a minimum accepted value) are required for this calculation.   Medical History: Past Medical History  Diagnosis Date  . Kidney stone   . Anxiety   . Gonorrhea   . Afib     Medications:   (Not in a hospital admission) No home medications  Assessment: 32 yo M with a history of paroxysmal afib admitted 06/20/11 with palpitations found to be in afib with RVR.  Pharmacy consulted to dose heparin.  Patient denies recent bleeding.  Goal of Therapy:  Heparin level 0.3-0.7 units/ml Monitor platelets by anticoagulation protocol: Yes   Plan:  Give 5000 units bolus x 1 Start heparin infusion at 1300 units/hr Check anti-Xa level in 6 hours and daily while on heparin Continue to monitor H&H and platelets   Thank you for allowing pharmacy to be a part of this patients care team.  Lovenia Kim Pharm.D., BCPS Clinical Pharmacist 06/19/2012 9:03 AM Pager: [phone] Phone: [phone]

## 2012-06-19 NOTE — ED Notes (Signed)
EDP in to room. HR 99 irregular.

## 2012-06-19 NOTE — ED Provider Notes (Addendum)
History     CSN: [id number]  Arrival date & time 06/19/12  0505   First MD Initiated Contact with Patient 06/19/12 [phone]      Chief Complaint  Patient presents with  . Palpitations  . Irregular Heart Beat  . Shortness of Breath    (Consider location/radiation/quality/duration/timing/severity/associated sxs/prior treatment) Patient is a 32 y.o. male presenting with palpitations and shortness of breath. The history is provided by the patient.  Palpitations  Associated symptoms include shortness of breath. Pertinent negatives include no diaphoresis, no chest pain, no abdominal pain, no nausea, no vomiting, no back pain, no dizziness and no cough.  Shortness of Breath Associated symptoms: no abdominal pain, no chest pain, no cough, no diaphoresis, no neck pain, no rash and no vomiting    Patient presents with rapid heart rate and palpitations. States started about 4 in the morning and is eating a snack any kind of choked and coughed and then he started feeling regular heartbeat in that he got faster. Patient felt that it was very fast at home. Associated with some mild shortness of breath no chest pain. Patient's had a history of atrial fib at times not currently on any medications. Last episode was in September which required cardioversion at that time he was not admitted. They were unable to control the heart rate with medications. Patient has never followed up with cardiology. Has never had an outpatient workup.  Past Medical History  Diagnosis Date  . Kidney stone   . Anxiety   . Gonorrhea   . Afib     Past Surgical History  Procedure Laterality Date  . Hand surgery    . Hand surgery      History reviewed. No pertinent family history.  History  Substance Use Topics  . Smoking status: Current Every Day Smoker -- 1.00 packs/day    Types: Cigarettes  . Smokeless tobacco: Not on file  . Alcohol Use: No      Review of Systems  Constitutional: Negative for diaphoresis.   HENT: Negative for congestion and neck pain.   Eyes: Negative for visual disturbance.  Respiratory: Positive for shortness of breath. Negative for cough.   Cardiovascular: Positive for palpitations. Negative for chest pain and leg swelling.  Gastrointestinal: Negative for nausea, vomiting and abdominal pain.  Genitourinary: Negative for dysuria.  Musculoskeletal: Negative for back pain.  Skin: Negative for rash.  Neurological: Negative for dizziness, syncope and light-headedness.  Hematological: Does not bruise/bleed easily.    Allergies  Review of patient's allergies indicates no known allergies.  Home Medications  No current outpatient prescriptions on file.  BP 95/65  Pulse 103  Temp(Src) 98.7 F (37.1 C) (Oral)  Resp 18  SpO2 100%  Physical Exam  Vitals reviewed. Constitutional: He is oriented to person, place, and time. He appears well-developed and well-nourished. No distress.  HENT:  Head: Normocephalic and atraumatic.  Left Ear: External ear normal.  Eyes: Conjunctivae are normal. Pupils are equal, round, and reactive to light.  Neck: Normal range of motion. Neck supple.  Cardiovascular: Intact distal pulses.   No murmur heard. Irregular tachycardic rhythm.  Pulmonary/Chest: Effort normal and breath sounds normal. No respiratory distress. He has no wheezes. He has no rales.  Abdominal: Soft. Bowel sounds are normal. There is no tenderness.  Musculoskeletal: Normal range of motion. He exhibits no edema and no tenderness.  Neurological: He is alert and oriented to person, place, and time. No cranial nerve deficit. He exhibits normal muscle tone. Coordination  normal.  Skin: Skin is warm. No erythema.    ED Course  Procedures (including critical care time)  Labs Reviewed  CBC WITH DIFFERENTIAL - Abnormal; Notable for the following:    Neutrophils Relative 42 (*)    All other components within normal limits  POCT I-STAT, CHEM 8 - Abnormal; Notable for the  following:    Potassium 3.2 (*)    Glucose, Bld 128 (*)    All other components within normal limits  POCT I-STAT TROPONIN I   Dg Chest Port 1 View  06/19/2012  *RADIOLOGY REPORT*  Clinical Data: Palpitations; irregular heartbeat.  Shortness of breath.  PORTABLE CHEST - 1 VIEW  Comparison: None.  Findings: The lungs are mildly hyperexpanded and appear clear. There is no evidence of focal opacification, pleural effusion or pneumothorax.  The cardiomediastinal silhouette is within normal limits.  No acute osseous abnormalities are seen.  IMPRESSION: Mildly hyperexpanded and clear lungs.   Original Report Authenticated By: Tonia Ghent, M.D.    Results for orders placed during the hospital encounter of 06/19/12  CBC WITH DIFFERENTIAL      Result Value Range   WBC 5.5  4.0 - 10.5 K/uL   RBC 4.94  4.22 - 5.81 MIL/uL   Hemoglobin 15.5  13.0 - 17.0 g/dL   HCT 16.1  09.6 - 04.5 %   MCV 88.1  78.0 - 100.0 fL   MCH 31.4  26.0 - 34.0 pg   MCHC 35.6  30.0 - 36.0 g/dL   RDW 40.9  81.1 - 91.4 %   Platelets 195  150 - 400 K/uL   Neutrophils Relative 42 (*) 43 - 77 %   Neutro Abs 2.3  1.7 - 7.7 K/uL   Lymphocytes Relative 46  12 - 46 %   Lymphs Abs 2.6  0.7 - 4.0 K/uL   Monocytes Relative 8  3 - 12 %   Monocytes Absolute 0.4  0.1 - 1.0 K/uL   Eosinophils Relative 3  0 - 5 %   Eosinophils Absolute 0.2  0.0 - 0.7 K/uL   Basophils Relative 1  0 - 1 %   Basophils Absolute 0.0  0.0 - 0.1 K/uL  POCT I-STAT, CHEM 8      Result Value Range   Sodium 139  135 - 145 mEq/L   Potassium 3.2 (*) 3.5 - 5.1 mEq/L   Chloride 103  96 - 112 mEq/L   BUN 11  6 - 23 mg/dL   Creatinine, Ser 7.82  0.50 - 1.35 mg/dL   Glucose, Bld 956 (*) 70 - 99 mg/dL   Calcium, Ion 2.13  0.86 - 1.23 mmol/L   TCO2 25  0 - 100 mmol/L   Hemoglobin 15.3  13.0 - 17.0 g/dL   HCT 57.8  46.9 - 62.9 %  POCT I-STAT TROPONIN I      Result Value Range   Troponin i, poc 0.01  0.00 - 0.08 ng/mL   Comment 3             Date: 06/19/2012   Rate: 128  Rhythm: atrial fibrillation  QRS Axis: normal  Intervals: normal  ST/T Wave abnormalities: normal  Conduction Disutrbances:none  Narrative Interpretation:   Old EKG Reviewed: none available    1. Atrial fibrillation with rapid ventricular response   2. Hypokalemia     CRITICAL CARE Performed by: Shelda Jakes.   Total critical care time: 30  Critical care time was exclusive of separately billable procedures and treating  other patients.  Critical care was necessary to treat or prevent imminent or life-threatening deterioration.  Critical care was time spent personally by me on the following activities: development of treatment plan with patient and/or surrogate as well as nursing, discussions with consultants, evaluation of patient's response to treatment, examination of patient, obtaining history from patient or surrogate, ordering and performing treatments and interventions, ordering and review of laboratory studies, ordering and review of radiographic studies, pulse oximetry and re-evaluation of patient's condition.      MDM   The patient arrived by his own vehicle. He was in rapid atrial fib rate around 128. Some mild shortness of breath feelings of palpitations no real chest pain. Patient has a history of atrial fib last event was then September 2013. He was cardioverted in ED because they couldn't control it with medications. He is never followed up with cardiology. This is about his third episode of this happening according to the patient.   Here I gave him 10 mg grams of diltiazem a.m. IV push heart rate came down nicely now is between 80 and 103 patient feels better chest x-ray was negative troponin was negative electrolytes showed mild hypokalemia he was given 40 mg of potassium by mouth.   Cardiology consult schedules shows Dr Sharyn Lull but Dr. Algie Coffer covering for him. Him will see the patient in consultation. Suspect patient can be discharged home will  need outpatient echocardiogram and may need to start an aspirin a day and or calcium, blocker as per their recommendation.   Shelda Jakes, MD 06/19/12 [phone]   Addendum: Cardiology Dr. Algie Coffer will go ahead and admit and do the workup in the hospital. He has placed a [facility] orders.  Shelda Jakes, MD 06/19/12 [phone]

## 2012-06-19 NOTE — ED Notes (Signed)
Floor nurse unable to take report at the time. To call back. 

## 2012-06-19 NOTE — Progress Notes (Signed)
ANTICOAGULATION CONSULT NOTE - Follow Up Consult  Pharmacy Consult for heparin Indication: atrial fibrillation  No Known Allergies  Patient Measurements: Height: 6\' 3"  (190.5 cm) Weight: 160 lb (72.576 kg) IBW/kg (Calculated) : 84.5 Heparin Dosing Weight: 84 kg  Vital Signs: Temp: 98.2 F (36.8 C) (02/15 1400) Temp src: Oral (02/15 1400) BP: 107/66 mmHg (02/15 1609) Pulse Rate: 57 (02/15 1400)  Labs:  Recent Labs  06/19/12 0549 06/19/12 0555 06/19/12 0828 06/19/12 1541  HGB 15.5 15.3 14.2  --   HCT 43.5 45.0 41.0  --   PLT 195  --  165  --   HEPARINUNFRC  --   --   --  0.72*  CREATININE  --  0.90 0.80  --     Estimated Creatinine Clearance: 137.4 ml/min (by C-G formula based on Cr of 0.8).   Medications:  Scheduled:  . aspirin EC  81 mg Oral Daily  . [COMPLETED] digoxin  0.25 mg Intravenous Daily  . [COMPLETED] diltiazem  10 mg Intravenous Once  . diltiazem  30 mg Oral QID  . [COMPLETED] heparin  5,000 Units Intravenous Once  . [COMPLETED] potassium chloride  40 mEq Oral Once  . [COMPLETED] sodium chloride  500 mL Intravenous Once  . sodium chloride  3 mL Intravenous Q12H  . [DISCONTINUED] heparin  5,000 Units Subcutaneous Q8H   Infusions:  . sodium chloride 100 mL/hr at 06/19/12 0729  . heparin 1,300 Units/hr (06/19/12 1610)    Assessment: 32 yo male with atrial fib is currently on supratherapeutic heparin.  Heparin level was 0.72. Goal of Therapy:  Heparin level 0.3-0.7 units/ml Monitor platelets by anticoagulation protocol: Yes   Plan:  1) Reduce heparin drip to 1200 units/hr.  2) Check 6hr heparin level after rate is changed.  Kierra Jezewski, Tsz-Yin 06/19/2012,4:33 PM

## 2012-06-20 LAB — BASIC METABOLIC PANEL
BUN: 10 mg/dL (ref 6–23)
CO2: 24 mEq/L (ref 19–32)
Calcium: 9.2 mg/dL (ref 8.4–10.5)
Creatinine, Ser: 0.87 mg/dL (ref 0.50–1.35)

## 2012-06-20 LAB — CBC
MCHC: 34.5 g/dL (ref 30.0–36.0)
MCV: 88.4 fL (ref 78.0–100.0)
Platelets: 182 10*3/uL (ref 150–400)
RDW: 12.9 % (ref 11.5–15.5)
WBC: 5.4 10*3/uL (ref 4.0–10.5)

## 2012-06-20 MED ORDER — ASPIRIN 81 MG PO TBEC: 81.0000 mg | DELAYED_RELEASE_TABLET | Freq: Every day | ORAL | Status: DC

## 2012-06-20 MED ORDER — METOPROLOL SUCCINATE ER 25 MG PO TB24: 25.0000 mg | ORAL_TABLET | Freq: Every day | ORAL | Status: DC

## 2012-06-20 MED ORDER — DILTIAZEM HCL 60 MG PO TABS: 60.0000 mg | ORAL_TABLET | Freq: Two times a day (BID) | ORAL | Status: DC

## 2012-06-20 NOTE — Discharge Summary (Signed)
Physician Discharge Summary  Patient ID: Dylan Hoffman MRN: [id number] DOB/AGE: 11/18/1980 32 y.o.  Admit date: 06/19/2012 Discharge date: 06/20/2012  Admission Diagnoses: Atrial fibrillation with RVR.  Dizziness  Discharge Diagnoses:  Principle Problem:  * Atrial fibrillation with RVR.*  Dizziness-secondary to atrial fibrillation   Discharged Condition: good  Hospital Course: 32 years old male with paroxysmal atrial fibrillation cardioverted with diltiazem use. His ultrasound of heart showed good LV systolic function with borderline LA size. Small dose B-blocker was also added for night time heart rate control. He was advised to avoid excess fluid intake at one time and decrease caffeine intake at one time. May use banana or orange juice 3 times a week for potassium supplement.  Consults: None  Significant Diagnostic Studies: labs: Normal CBC and BMET except low K+ of 3.2 improving to 3.8 with K+ supplementation.  Treatments: cardiac meds: diltiazem  Discharge Exam: Blood pressure 113/67, pulse 57, temperature 97.8 F (36.6 C), temperature source Oral, resp. rate 18, height 6\' 3"  (1.905 m), weight 74.435 kg (164 lb 1.6 oz), SpO2 99.00%. Constitutional: He appears well-developed and well-nourished. No distress.  HENT: Head: Normocephalic and atraumatic. Eyes: Conjunctivae are normal. Pupils are equal, round, and reactive to light.  Neck: Normal range of motion. Neck supple.  Cardiovascular: Intact distal pulses. No murmur heard. Irregular tachycardic rhythm.  Pulmonary/Chest: Effort normal and breath sounds normal. No respiratory distress.  Abdominal: Soft. Bowel sounds are normal. There is no tenderness.  Musculoskeletal: Normal range of motion. He exhibits no edema and no tenderness.  Neurological: He is alert and oriented to person, place, and time. No cranial nerve deficit. He exhibits normal muscle tone. Coordination normal.  Skin: Skin is warm. No erythema.     Disposition: 01-Home or Self Care     Medication List    TAKE these medications       aspirin 81 MG EC tablet  Take 1 tablet (81 mg total) by mouth daily.     diltiazem 60 MG tablet  Commonly known as:  CARDIZEM  Take 1 tablet (60 mg total) by mouth every 12 (twelve) hours.     metoprolol succinate 25 MG 24 hr tablet  Commonly known as:  TOPROL XL  Take 1 tablet (25 mg total) by mouth at bedtime.           Follow-up Information   Follow up with [facility] EMERGENCY DEPARTMENT. (If symptoms worsen)    Contact information:   [id number] [place] [address] [phone]      Signed: Ricki Rodriguez 06/20/2012, 11:58 AM

## 2012-06-20 NOTE — Progress Notes (Signed)
Utilization review completed.  

## 2012-06-20 NOTE — Progress Notes (Signed)
ANTICOAGULATION CONSULT NOTE - Follow Up Consult  Pharmacy Consult for heparin Indication: atrial fibrillation  Labs:  Recent Labs  06/19/12 0549 06/19/12 0555 06/19/12 0828 06/19/12 1541 06/19/12 2311  HGB 15.5 15.3 14.2  --   --   HCT 43.5 45.0 41.0  --   --   PLT 195  --  165  --   --   HEPARINUNFRC  --   --   --  0.72* 0.65  CREATININE  --  0.90 0.80  --   --      Assessment/Plan: 32yo male now therapeutic on heparin after rate decrease.  Will continue gtt at current rate and confirm stable with am labs.  Colleen Can PharmD BCPS 06/20/2012,12:06 AM

## 2012-06-20 NOTE — Progress Notes (Signed)
ANTICOAGULATION CONSULT NOTE - Follow Up Consult  Pharmacy Consult for heparin Indication: atrial fibrillation  Labs:  Recent Labs  06/19/12 0549 06/19/12 0555 06/19/12 0828 06/19/12 1541 06/19/12 2311 06/20/12 0640  HGB 15.5 15.3 14.2  --   --  14.7  HCT 43.5 45.0 41.0  --   --  42.6  PLT 195  --  165  --   --  182  HEPARINUNFRC  --   --   --  0.72* 0.65 0.69  CREATININE  --  0.90 0.80  --   --  0.87     Assessment/Plan: 32 yo M with a history of paroxysmal afib admitted 06/20/11 with palpitations found to be in afib with RVR. Pharmacy consulted to dose heparin.   Anticoagulation/Cardiology:  Afib, heparin @1200  units/hr. Diltiazem PO, and ASA  CBC stable.  No bleeding noted.   Heparin level at goal.  - Continue heparin at current rate, recheck in am  Thank you for allowing pharmacy to be a part of this patients care team.  Lovenia Kim Pharm.D., BCPS Clinical Pharmacist 06/20/2012 9:56 AM Pager: [phone] Phone: [phone]

## 2013-04-02 ENCOUNTER — Emergency Department (HOSPITAL_COMMUNITY): Payer: Self-pay

## 2013-04-02 ENCOUNTER — Encounter (HOSPITAL_COMMUNITY): Payer: Self-pay | Admitting: Emergency Medicine

## 2013-04-02 ENCOUNTER — Emergency Department (HOSPITAL_COMMUNITY)
Admission: EM | Admit: 2013-04-02 | Discharge: 2013-04-02 | Disposition: A | Discharge status: Home / Self Care | Payer: Self-pay | Attending: Emergency Medicine | Admitting: Emergency Medicine

## 2013-04-02 DIAGNOSIS — Z7982 Long term (current) use of aspirin: Secondary | ICD-10-CM | POA: Insufficient documentation

## 2013-04-02 DIAGNOSIS — R002 Palpitations: Secondary | ICD-10-CM | POA: Insufficient documentation

## 2013-04-02 DIAGNOSIS — R52 Pain, unspecified: Secondary | ICD-10-CM | POA: Insufficient documentation

## 2013-04-02 DIAGNOSIS — Z9119 Patient's noncompliance with other medical treatment and regimen: Secondary | ICD-10-CM | POA: Insufficient documentation

## 2013-04-02 DIAGNOSIS — R9431 Abnormal electrocardiogram [ECG] [EKG]: Secondary | ICD-10-CM | POA: Insufficient documentation

## 2013-04-02 DIAGNOSIS — F411 Generalized anxiety disorder: Secondary | ICD-10-CM | POA: Insufficient documentation

## 2013-04-02 DIAGNOSIS — Z8709 Personal history of other diseases of the respiratory system: Secondary | ICD-10-CM | POA: Insufficient documentation

## 2013-04-02 DIAGNOSIS — R000 Tachycardia, unspecified: Secondary | ICD-10-CM | POA: Insufficient documentation

## 2013-04-02 DIAGNOSIS — Z91199 Patient's noncompliance with other medical treatment and regimen due to unspecified reason: Secondary | ICD-10-CM | POA: Insufficient documentation

## 2013-04-02 DIAGNOSIS — Z87442 Personal history of urinary calculi: Secondary | ICD-10-CM | POA: Insufficient documentation

## 2013-04-02 DIAGNOSIS — I4891 Unspecified atrial fibrillation: Secondary | ICD-10-CM | POA: Diagnosis present

## 2013-04-02 DIAGNOSIS — F17200 Nicotine dependence, unspecified, uncomplicated: Secondary | ICD-10-CM | POA: Insufficient documentation

## 2013-04-02 DIAGNOSIS — Z79899 Other long term (current) drug therapy: Secondary | ICD-10-CM | POA: Insufficient documentation

## 2013-04-02 DIAGNOSIS — Z8619 Personal history of other infectious and parasitic diseases: Secondary | ICD-10-CM | POA: Insufficient documentation

## 2013-04-02 HISTORY — DX: Other pneumothorax: J93.83

## 2013-04-02 LAB — COMPREHENSIVE METABOLIC PANEL
ALT: 12 U/L (ref 0–53)
Albumin: 3.9 g/dL (ref 3.5–5.2)
Alkaline Phosphatase: 42 U/L (ref 39–117)
Calcium: 9.2 mg/dL (ref 8.4–10.5)
GFR calc Af Amer: >90 mL/min (ref >90)
Potassium: 4.2 mEq/L (ref 3.5–5.1)
Sodium: 137 mEq/L (ref 135–145)
Total Protein: 6.7 g/dL (ref 6.0–8.3)

## 2013-04-02 LAB — CBC WITH DIFFERENTIAL/PLATELET
Basophils Absolute: 0 10*3/uL (ref 0.0–0.1)
Basophils Relative: 0 % (ref 0–1)
Eosinophils Absolute: 0.1 10*3/uL (ref 0.0–0.7)
Eosinophils Relative: 3 % (ref 0–5)
Lymphs Abs: 1.3 10*3/uL (ref 0.7–4.0)
MCH: 31.3 pg (ref 26.0–34.0)
MCHC: 34.4 g/dL (ref 30.0–36.0)
MCV: 90.8 fL (ref 78.0–100.0)
Neutrophils Relative %: 60 % (ref 43–77)
Platelets: 149 10*3/uL — ABNORMAL LOW (ref 150–400)
RBC: 4.48 MIL/uL (ref 4.22–5.81)
RDW: 13 % (ref 11.5–15.5)

## 2013-04-02 MED ORDER — FLECAINIDE ACETATE 100 MG PO TABS
300.0000 mg | ORAL_TABLET | Freq: Once | ORAL | Status: AC
  Administered 2013-04-02: 300 mg via ORAL
  Filled 2013-04-02: qty 3

## 2013-04-02 MED ORDER — ASPIRIN 81 MG PO TBEC: 81.0000 mg | DELAYED_RELEASE_TABLET | Freq: Every day | ORAL | Status: DC

## 2013-04-02 MED ORDER — METOPROLOL SUCCINATE ER 50 MG PO TB24: 50.0000 mg | ORAL_TABLET | Freq: Every day | ORAL | Status: DC

## 2013-04-02 MED ORDER — SODIUM CHLORIDE 0.9 % IV BOLUS (SEPSIS)
1000.0000 mL | Freq: Once | INTRAVENOUS | Status: AC
  Administered 2013-04-02: 1000 mL via INTRAVENOUS

## 2013-04-02 NOTE — ED Notes (Signed)
Hx of a-fib.  Pt. Felt palpitations this am along with feeling clammy.  Pt. Denies any chest pain.  Paramedics arrived HR between 145-180  Paramedics gave 20 mg IV Cardizem HR presently 90, A-fib present.  Pt. Stopped taking his Medication due to price.  Pt. Stable presently.Skin is p/w/d.

## 2013-04-02 NOTE — ED Provider Notes (Signed)
CSN: [id number]     Arrival date & time 04/02/13  1417 History   First MD Initiated Contact with Patient 04/02/13 1418     Chief Complaint  Patient presents with  . Irregular Heart Beat    HPI  Patient presents with palpitations, generalized discomfort. Patient was in his usual state of health until this morning, with symptoms began.  Initially there was no rapid heart rate, just a sense of irregular palpitations.  After EMS arrival, patient also had increased heart rate. The symptoms improved prior to my evaluation. Patient had heart rate between 145 and 180, per EMS. Following provision of Cardizem, heart rate decreased. Patient denies pain, fevers, chills, nausea, vomiting, diarrhea. He does not drink. Patient states that he has not taken medication in months.   Past Medical History  Diagnosis Date  . Kidney stone   . Anxiety   . Gonorrhea   . Afib    Past Surgical History  Procedure Laterality Date  . Hand surgery    . Hand surgery     No family history on file. History  Substance Use Topics  . Smoking status: Current Every Day Smoker -- 0.50 packs/day    Types: Cigarettes  . Smokeless tobacco: Not on file  . Alcohol Use: No    Review of Systems  Constitutional:       Per HPI, otherwise negative  HENT:       Per HPI, otherwise negative  Respiratory:       Per HPI, otherwise negative  Cardiovascular:       Per HPI, otherwise negative  Gastrointestinal: Negative for vomiting.  Endocrine:       Negative aside from HPI  Genitourinary:       Neg aside from HPI   Musculoskeletal:       Per HPI, otherwise negative  Skin: Negative.   Neurological: Negative for syncope.    Allergies  Review of patient's allergies indicates no known allergies.  Home Medications   Current Outpatient Rx  Name  Route  Sig  Dispense  Refill  . aspirin EC 81 MG EC tablet   Oral   Take 1 tablet (81 mg total) by mouth daily.         [name] diltiazem (CARDIZEM) 60 MG tablet  Oral   Take 1 tablet (60 mg total) by mouth every 12 (twelve) hours.   60 tablet   1   . metoprolol succinate (TOPROL XL) 25 MG 24 hr tablet   Oral   Take 1 tablet (25 mg total) by mouth at bedtime.   30 tablet   1    BP 96/67  Pulse 93  Temp(Src) 98.3 F (36.8 C)  Resp 18  Ht 6\' 3"  (1.905 m)  Wt 160 lb (72.576 kg)  BMI 20.00 kg/m2  SpO2 100% Physical Exam  Nursing note and vitals reviewed. Constitutional: He is oriented to person, place, and time. He appears well-developed. No distress.  HENT:  Head: Normocephalic and atraumatic.  Eyes: Conjunctivae and EOM are normal.  Cardiovascular: Normal rate.  An irregularly irregular rhythm present.  Pulmonary/Chest: Effort normal. No stridor. No respiratory distress.  Abdominal: He exhibits no distension.  Musculoskeletal: He exhibits no edema.  Neurological: He is alert and oriented to person, place, and time.  Skin: Skin is warm and dry.  Psychiatric: He has a normal mood and affect.    ED Course  Procedures (including critical care time) Labs Review Labs Reviewed  CBC WITH DIFFERENTIAL  COMPREHENSIVE METABOLIC PANEL   Imaging Review No results found.  EKG Interpretation    Date/Time:  Saturday April 02 2013 14:39:35 EST Ventricular Rate:  97 PR Interval:    QRS Duration: 84 QT Interval:  350 QTC Calculation: 445 R Axis:   -16 Text Interpretation:  Atrial fibrillation Borderline left axis deviation Atrial fibrillation Left axis deviation Artifact Abnormal ekg Confirmed by Gerhard Munch  MD [phone]) on 04/02/2013 2:53:56 PM           After the initial evaluation her.  The patient's chart.  Cardiac monitor 95 irregular abnormal Pulse oximetry 99% normal  Update:  With return of the patient's labs I discussed his case with our cardiology team.  The patient remains tachycardic w afib. Flecanide ordered.   7:04 PM Patient in SR, with HR ~70 MDM   1. Atrial fibrillation    This patient presents  with atrial fibrillation.  On exam the patient is in no distress.  Patient has been noncompliant with medication, secondary to monetary concerns.  Following discussion with cardiology, the patient received a dose of flecainide.  Subsequently he converted to sinus rhythm.  He was stable for discharge with primary care/cardiology followup.    Gerhard Munch, MD 04/02/13 [phone]

## 2013-04-02 NOTE — ED Notes (Signed)
Dinner tray ordered for patient as ok'd by Jeraldine Loots, EDP.

## 2013-04-02 NOTE — Consult Note (Signed)
CARDIOLOGY CONSULT NOTE   Patient ID: Dylan Hoffman MRN: [id number] DOB/AGE: Aug 30, 1980 32 y.o.  Admit date: 04/02/2013  Primary Physician   Default, Provider, MD Primary Cardiologist   DB 2011, [name] Feb. 2014 Reason for Consultation   PAF  Dylan Hoffman:WJXBJ BARRE Dylan Hoffman is a 32 y.o. male with a history of PAF.  He had ER visits in 2011 (converted with Flecainide), 2013 (DCCV) and Feb. 2014, was in SR at d/c.  He was in his usual state of health last pm. He took Suboxone x 1 dose last pm, no ETOH, chronic THC use.   Woke up this am, feeling OK. At about 9:30 or 10 am, had onset of palpitations. Knew he had gone out of rhythm. Called EMS, he was in rapid atrial fib on their arrival. They gave him Cardizem IV, heart rate control was improved on the medication but he is still in atrial fib.   He has occasional brief palpitations at times, occasionally the palpitations cause dizziness, but no hx syncope. Never gets chest pain. Is active at work but does not exercise. No SOB with exertion.   Past Medical History  Diagnosis Date  . Kidney stone   . Anxiety   . Gonorrhea   . Afib   . Spontaneous pneumothorax     "years ago"     Past Surgical History  Procedure Laterality Date  . Hand surgery      No Known Allergies  I have reviewed the patient's current medications Not taking any medications  History   Social History  . Marital Status: Single    Spouse Name: N/A    Number of Children: N/A  . Years of Education: N/A   Occupational History  . 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    Social History Main Topics  . Smoking status: Current Every Day Smoker -- 0.50 packs/day for 15 years    Types: Cigarettes  . Smokeless tobacco: Not on file  . Alcohol Use: Yes     Comment: 1-2 beers  . Drug Use: 1.00 per week    Special: Marijuana  . Sexual Activity: Not on file   Other Topics Concern  . 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     Family Status  Relation Status Death Age  . Mother Alive     No Hx CAD; in her 73s  . Father Alive     No Hx CAD; in his 109s   ROS:  Full 14 point review of systems complete and found to be negative unless listed above.  Physical Exam: Blood pressure 106/69, pulse 99, temperature 98.3 F (36.8 C), resp. rate 20, height 6\' 3"  (1.905 m), weight 160 lb (72.576 kg), SpO2 100.00%.  General: Well developed, well nourished, male in no acute distress Head: Eyes PERRLA, No xanthomas.   Normocephalic and atraumatic, oropharynx without edema or exudate. Dentition: good Lungs: CTA bilaterally Heart: Heart irregular rate and rhythm with S1, S2, no murmur. pulses are 2+ all 4 extrem.   Neck: No carotid bruits. No lymphadenopathy.  No JVD elevation Abdomen: Bowel sounds present, abdomen soft and non-tender without masses or hernias noted. Msk:  No spine or cva tenderness. No weakness, no joint deformities or effusions. Extremities: No clubbing or cyanosis. No edema.  Neuro: Alert and oriented X 3. No focal deficits noted. Psych:  Good affect, responds appropriately Skin: No rashes or  lesions noted.  Labs:   Lab Results  Component Value Date   WBC 4.3 04/02/2013   HGB 14.0 04/02/2013   HCT 40.7 04/02/2013   MCV 90.8 04/02/2013   PLT 149* 04/02/2013     Recent Labs Lab 04/02/13 1452  NA 137  K 4.2  CL 103  CO2 27  BUN 11  CREATININE 0.85  CALCIUM 9.2  PROT 6.7  BILITOT 0.9  ALKPHOS 42  ALT 12  AST 17  GLUCOSE 82  ALBUMIN 3.9    Recent Labs  04/02/13 1502  TROPIPOC 0.01   TSH  Date/Time Value Range Status  06/20/2009  9:16 AM 1.483   0.350 - 4.500 uIU/mL Final   Drugs of Abuse  No results found for this basename: labopia,  cocainscrnur,  labbenz,  amphetmu,  thcu,  labbarb     Echo: 06/19/2012 Conclusions - Left ventricle: The cavity size was normal. Systolic function was normal. The estimated ejection fraction was in the  range of 55% to 60%. Wall motion was normal; there were no regional wall motion abnormalities. - Mitral valve: Mild systolic bowing without prolapse, involving the anterior leaflet and the posterior leaflet.  ECG:  04/02/2013 Atrial fib Vent. rate 97 BPM PR interval * ms QRS duration 84 ms QT/QTc 350/445 ms P-R-T axes -1 -16 68  Radiology:  Dg Chest 2 View 04/02/2013   CLINICAL DATA:  Irregular heartbeat, smoker  EXAM: CHEST  2 VIEW  COMPARISON:  06/19/2012  FINDINGS: Lungs are essentially clear. No focal consolidation. No pleural effusion or pneumothorax.  Heart is normal in size.  Visualized osseous structures are within normal limits.  IMPRESSION: No evidence of acute cardiopulmonary disease.   Electronically Signed   By: Charline Bills M.D.   On: 04/02/2013 15:36    ASSESSMENT AND PLAN:   The patient was seen today by Dr. Jens Som, the patient evaluated and the data reviewed.  Principal Problem:   Atrial fibrillation with rapid ventricular response - pt is appropriate for Flecainide, will order. Keep NPO in case Flecainide does not work, can DCCV if necessary. Add Toprol XL 50 mg as daily med. MD advise on PRN Cardizem or Flecainide as "pill in pocket" approach to prolonged palpitations. F/u in office, if having frequent palpitations, may need monitor to determine if anti-arrhythmic is needed.  Encourage smoking cessation. MD advise on drug screen.  SignedTheodore Demark, PA-C 04/02/2013 4:59 PM Beeper [phone]  Co-Sign MD As above, patient seen and examined. Briefly he is a 32 year old male with a past medical history of paroxysmal atrial fibrillation. No history of diabetes, hypertension, prior CVA or congestive heart failure. LV function on most recent echocardiogram in February of 2014 was normal. Patient typically does not have dyspnea on exertion, orthopnea, PND, pedal edema, exertional chest pain and no history of syncope. At approximately 9:30 AM the patient developed  palpitations similar to his previous bouts of atrial fibrillation. No other symptoms noted. He presents to the emergency room and is found to be in atrial fibrillation with a rapid ventricular response. His rate slowed with IV Cardizem. Note he was not taking his medications at home. The duration of his atrial fibrillation is approximately 8 hours. His rate is presently 90. We will give flecainide 300 mg by mouth x1. Hopefully he will convert with this. I would then discharge on Toprol 50 mg daily and aspirin 81 mg daily. He will followup with me in the clinic. If he has recurrent bouts and is compliant  with medications we could consider adding an antiarrhythmic in the future such as flecainide. Patient counseled on discontinuing tobacco and marijuana use. Olga Millers 5:07 PM

## 2013-05-11 ENCOUNTER — Observation Stay (HOSPITAL_COMMUNITY)
Admission: EM | Admit: 2013-05-11 | Discharge: 2013-05-12 | Disposition: A | Discharge status: Home / Self Care | Payer: Self-pay | Attending: Cardiovascular Disease | Admitting: Cardiovascular Disease

## 2013-05-11 ENCOUNTER — Encounter (HOSPITAL_COMMUNITY): Payer: Self-pay | Admitting: Emergency Medicine

## 2013-05-11 ENCOUNTER — Other Ambulatory Visit: Payer: Self-pay

## 2013-05-11 DIAGNOSIS — A5400 Gonococcal infection of lower genitourinary tract, unspecified: Secondary | ICD-10-CM | POA: Insufficient documentation

## 2013-05-11 DIAGNOSIS — R51 Headache: Secondary | ICD-10-CM | POA: Insufficient documentation

## 2013-05-11 DIAGNOSIS — F17200 Nicotine dependence, unspecified, uncomplicated: Secondary | ICD-10-CM | POA: Insufficient documentation

## 2013-05-11 DIAGNOSIS — F411 Generalized anxiety disorder: Secondary | ICD-10-CM | POA: Insufficient documentation

## 2013-05-11 DIAGNOSIS — K219 Gastro-esophageal reflux disease without esophagitis: Secondary | ICD-10-CM | POA: Insufficient documentation

## 2013-05-11 DIAGNOSIS — I4891 Unspecified atrial fibrillation: Principal | ICD-10-CM | POA: Insufficient documentation

## 2013-05-11 HISTORY — DX: Headache: R51

## 2013-05-11 HISTORY — DX: Gastro-esophageal reflux disease without esophagitis: K21.9

## 2013-05-11 HISTORY — DX: Attention-deficit hyperactivity disorder, unspecified type: F90.9

## 2013-05-11 HISTORY — DX: Headache, unspecified: R51.9

## 2013-05-11 MED ORDER — METOPROLOL TARTRATE 1 MG/ML IV SOLN
5.0000 mg | Freq: Once | INTRAVENOUS | Status: AC
  Administered 2013-05-11: 5 mg via INTRAVENOUS
  Filled 2013-05-11: qty 5

## 2013-05-11 MED ORDER — DILTIAZEM HCL 25 MG/5ML IV SOLN
15.0000 mg | Freq: Once | INTRAVENOUS | Status: AC
  Administered 2013-05-11: 15 mg via INTRAVENOUS
  Filled 2013-05-11: qty 5

## 2013-05-11 MED ORDER — DILTIAZEM HCL 100 MG IV SOLR
5.0000 mg/h | Freq: Once | INTRAVENOUS | Status: AC
  Administered 2013-05-11: 5 mg/h via INTRAVENOUS
  Filled 2013-05-11: qty 100

## 2013-05-11 MED ORDER — METOPROLOL TARTRATE 1 MG/ML IV SOLN
5.0000 mg | Freq: Once | INTRAVENOUS | Status: AC
Start: 2013-05-11 — End: 2013-05-11
  Administered 2013-05-11: 5 mg via INTRAVENOUS
  Filled 2013-05-11: qty 5

## 2013-05-11 MED ORDER — ENOXAPARIN SODIUM 100 MG/ML ~~LOC~~ SOLN
80.0000 mg | Freq: Once | SUBCUTANEOUS | Status: AC
  Administered 2013-05-11: 80 mg via SUBCUTANEOUS
  Filled 2013-05-11: qty 1

## 2013-05-11 MED ORDER — FLECAINIDE ACETATE 100 MG PO TABS
300.0000 mg | ORAL_TABLET | Freq: Once | ORAL | Status: AC
  Administered 2013-05-11: 300 mg via ORAL
  Filled 2013-05-11: qty 3

## 2013-05-11 NOTE — ED Notes (Signed)
Pt was eating and felt flutter in chest, asymptomatic at this time. Pt states he has had bouts of a-fib for 8 years off and on. Only med pt is taking is Metoprolol. Had a similar episode 2 months prior.

## 2013-05-11 NOTE — ED Notes (Signed)
Pt states he could not afford other medications for a-fib because he had no insurance.

## 2013-05-11 NOTE — ED Provider Notes (Signed)
CSN: [id number]     Arrival date & time 05/11/13  1613 History  This chart was scribed for Ward Givens, MD by Danella Maiers, ED Scribe. This patient was seen in room APA04/APA04 and the patient's care was started at 4:16 PM.   Chief Complaint  Patient presents with  . Atrial Fibrillation   The history is provided by the patient. No language interpreter was used.   HPI Comments: Dylan Hoffman is a 33 y.o. male who presents to the Emergency Department complaining of feeling a flutter in his chest one hour ago while eating. He states he swallowed something too big and it went down really slowly and painful and that's when his heart started beating fast and fluttering. He also reports chills and mild lightheadedness at times.He states he feels hot and clammy at times.  He reports prior h/o A-fib 5-6 times in the last 8 years, last episode was 2 months ago. He has a heart doctor that he is supposed to go see but hasn't been. He takes Metoprolol 50 mg every night before bed and baby aspirin every morning. He has been compliant with the dosage for the last 10 days. Before this he was in jail for 2 weeks taking 25mg  Metoprolol. He denies CP, SOB, nausea, vomiting. He smokes 1/3 ppd. He does not drink or use IV drugs.   PCP none  Past Medical History  Diagnosis Date  . Kidney stone   . Anxiety   . Gonorrhea   . Afib   . Spontaneous pneumothorax     "years ago"   Past Surgical History  Procedure Laterality Date  . Hand surgery     History reviewed. No pertinent family history. History  Substance Use Topics  . Smoking status: Current Every Day Smoker -- 0.50 packs/day for 15 years    Types: Cigarettes  . Smokeless tobacco: Not on file  . Alcohol Use: Yes     Comment: 1-2 beers  employed painting [employer] Denies street drugs except THC  Review of Systems  Constitutional: Positive for chills.  Cardiovascular: Positive for palpitations.  Gastrointestinal: Negative for nausea and  vomiting.  Neurological: Positive for light-headedness.  All other systems reviewed and are negative.    Allergies  Review of patient's allergies indicates no known allergies.  Home Medications   Current Outpatient Rx  Name  Route  Sig  Dispense  Refill  . aspirin 81 MG EC tablet   Oral   Take 1 tablet (81 mg total) by mouth daily.   30 tablet   3   . metoprolol succinate (TOPROL XL) 50 MG 24 hr tablet   Oral   Take 1 tablet (50 mg total) by mouth at bedtime.   Filled 12/20   30 tablet   3       BP 108/79  Pulse 146  Temp(Src) 98.2 F (36.8 C) (Oral)  Resp 22  Ht 6\' 2"  (1.88 m)  Wt 165 lb (74.844 kg)  BMI 21.18 kg/m2  SpO2 97%  Vital signs normal except tachycardia  Physical Exam  Nursing note and vitals reviewed. Constitutional: He is oriented to person, place, and time. He appears well-developed and well-nourished.  Non-toxic appearance. He does not appear ill. No distress.  HENT:  Head: Normocephalic and atraumatic.  Right Ear: External ear normal.  Left Ear: External ear normal.  Nose: Nose normal. No mucosal edema or rhinorrhea.  Mouth/Throat: Oropharynx is clear and moist and mucous membranes are normal.  No dental abscesses or uvula swelling.  Eyes: Conjunctivae and EOM are normal. Pupils are equal, round, and reactive to light.  Neck: Normal range of motion and full passive range of motion without pain. Neck supple.  Cardiovascular: Normal heart sounds.  An irregularly irregular rhythm present. Tachycardia present.  Exam reveals no gallop and no friction rub.   No murmur heard. Pulmonary/Chest: Effort normal and breath sounds normal. No respiratory distress. He has no wheezes. He has no rhonchi. He has no rales. He exhibits no tenderness and no crepitus.  Abdominal: Soft. Normal appearance and bowel sounds are normal. He exhibits no distension. There is no tenderness. There is no rebound and no guarding.  Musculoskeletal: Normal range of motion. He  exhibits no edema and no tenderness.  Moves all extremities well.   Neurological: He is alert and oriented to person, place, and time. He has normal strength. No cranial nerve deficit.  Skin: Skin is warm, dry and intact. No rash noted. No erythema. No pallor.  Psychiatric: He has a normal mood and affect. His speech is normal and behavior is normal. His mood appears not anxious.    ED Course  Procedures (including critical care time) Medications  metoprolol (LOPRESSOR) injection 5 mg (5 mg Intravenous Given 05/11/13 1635)  flecainide (TAMBOCOR) tablet 300 mg (300 mg Oral Given 05/11/13 1646)  metoprolol (LOPRESSOR) injection 5 mg (5 mg Intravenous Given 05/11/13 1805)  metoprolol (LOPRESSOR) injection 5 mg (5 mg Intravenous Given 05/11/13 1943)  diltiazem (CARDIZEM) 100 mg in dextrose 5 % 100 mL infusion (5 mg/hr Intravenous New Bag/Given 05/11/13 2104)  diltiazem (CARDIZEM) injection 15 mg (0 mg Intravenous Stopped 05/11/13 2109)  enoxaparin (LOVENOX) injection 80 mg (80 mg Subcutaneous Given 05/11/13 2337)    DIAGNOSTIC STUDIES: Oxygen Saturation is 97% on RA, normal by my interpretation.    COORDINATION OF CARE: 4:29 PM- Discussed treatment plan with pt which includes IV metoprolol and oral flecainide. Pt agrees to plan. Labs not done since patient has had this problem before and has already been evaluated by a cardiologist.   Review of his past ED visit 11/29 he was given flecainide 300 mg orally after seen by [facility] while in the ED on Cardizem drip and not converting. He then converted to NSR. Per Dr. Jens Somrenshaw patient had cardioversion done in 2013. He converted in the ED with flecainide flecainide in 2011.  5:53 PM - Rechecked with pt. HR still 102-112, still in afib. Will give another dose of IV metoprolol. Pt agrees to plan.  7:12 PM - Rechecked with pt, still in afib. having 3-4 beats of sinus rhythm and then going back to afib. HR 90-118. Will give third dose of lopressor bolus.    8:37 PM - Rechecked with pt, still in afib. HR 95-105.   9:25 PM - Started on the dilitiazem bolus and drip. Hr is 75-85. Still in Afib but feels much better.   10:37 PM - Pt still in afib but HR is in the 70s.  22:47 Dr [place] CallasSharma, cardiology, [facility], wants lovenox started.   Labs Review Labs Reviewed - No data to display Imaging Review No results found.  EKG Interpretation    Date/Time:  Wednesday May 11 2013 16:07:03 EST Ventricular Rate:  139 PR Interval:    QRS Duration: 74 QT Interval:  248 QTC Calculation: 377 R Axis:   -15 Text Interpretation:  Atrial fibrillation with rapid ventricular response Abnormal ECG When compared with ECG  of 02-Apr-2013 14:39, SINCE LAST TRACING HEART RATE HAS INCREASED Confirmed by Jeyden Coffelt  MD-I, Marielis Samara ([id number]) on 05/11/2013 5:42:39 PM            MDM   1. Atrial fibrillation with rapid ventricular response    Plan transfer to [facility] for admission   Devoria Albe, MD, FACEP    I personally performed the services described in this documentation, which was scribed in my presence. The recorded information has been reviewed and considered.  Devoria Albe, MD, FACEP    Ward Givens, MD 05/12/13 0000

## 2013-05-12 ENCOUNTER — Encounter (HOSPITAL_COMMUNITY): Payer: Self-pay | Admitting: General Practice

## 2013-05-12 DIAGNOSIS — I4891 Unspecified atrial fibrillation: Secondary | ICD-10-CM

## 2013-05-12 LAB — CBC
HCT: 40.4 % (ref 39.0–52.0)
Hemoglobin: 13.8 g/dL (ref 13.0–17.0)
MCH: 31.2 pg (ref 26.0–34.0)
MCHC: 34.2 g/dL (ref 30.0–36.0)
MCV: 91.2 fL (ref 78.0–100.0)
PLATELETS: 162 10*3/uL (ref 150–400)
RBC: 4.43 MIL/uL (ref 4.22–5.81)
RDW: 12.5 % (ref 11.5–15.5)
WBC: 5.5 10*3/uL (ref 4.0–10.5)

## 2013-05-12 LAB — BASIC METABOLIC PANEL
BUN: 10 mg/dL (ref 6–23)
CALCIUM: 8.9 mg/dL (ref 8.4–10.5)
CHLORIDE: 105 meq/L (ref 96–112)
CO2: 28 meq/L (ref 19–32)
Creatinine, Ser: 0.92 mg/dL (ref 0.50–1.35)
GFR calc Af Amer: >90 mL/min (ref >90)
GFR calc non Af Amer: >90 mL/min (ref >90)
Glucose, Bld: 93 mg/dL (ref 70–99)
Potassium: 3.8 mEq/L (ref 3.7–5.3)
Sodium: 144 mEq/L (ref 137–147)

## 2013-05-12 LAB — PROTIME-INR
INR: 1.11 (ref 0.00–1.49)
PROTHROMBIN TIME: 14.1 s (ref 11.6–15.2)

## 2013-05-12 MED ORDER — DILTIAZEM HCL ER COATED BEADS 120 MG PO CP24
120.0000 mg | ORAL_CAPSULE | Freq: Every day | ORAL | Status: DC
  Administered 2013-05-12: 120 mg via ORAL
  Filled 2013-05-12: qty 1

## 2013-05-12 MED ORDER — SODIUM CHLORIDE 0.9 % IV SOLN: 250.0000 mL | INTRAVENOUS | Status: DC | PRN

## 2013-05-12 MED ORDER — METOPROLOL TARTRATE 25 MG PO TABS: 25.0000 mg | ORAL_TABLET | Freq: Two times a day (BID) | ORAL | Status: DC

## 2013-05-12 MED ORDER — ONDANSETRON HCL 4 MG/2ML IJ SOLN: 4.0000 mg | Freq: Four times a day (QID) | INTRAMUSCULAR | Status: DC | PRN

## 2013-05-12 MED ORDER — DEXTROSE 5 % IV SOLN
5.0000 mg/h | INTRAVENOUS | Status: DC
  Administered 2013-05-12: 5 mg/h via INTRAVENOUS
  Filled 2013-05-12: qty 100

## 2013-05-12 MED ORDER — SODIUM CHLORIDE 0.9 % IJ SOLN: 3.0000 mL | INTRAMUSCULAR | Status: DC | PRN

## 2013-05-12 MED ORDER — DILTIAZEM HCL ER COATED BEADS 120 MG PO CP24: 120.0000 mg | ORAL_CAPSULE | Freq: Every day | ORAL | Status: DC

## 2013-05-12 MED ORDER — METOPROLOL TARTRATE 25 MG PO TABS
25.0000 mg | ORAL_TABLET | Freq: Two times a day (BID) | ORAL | Status: DC
  Administered 2013-05-12: 25 mg via ORAL
  Filled 2013-05-12 (×3): qty 1

## 2013-05-12 MED ORDER — ENOXAPARIN SODIUM 80 MG/0.8ML ~~LOC~~ SOLN
75.0000 mg | Freq: Two times a day (BID) | SUBCUTANEOUS | Status: DC
  Administered 2013-05-12: 10:00:00 75 mg via SUBCUTANEOUS
  Filled 2013-05-12 (×2): qty 0.8

## 2013-05-12 MED ORDER — ACETAMINOPHEN 325 MG PO TABS: 650.0000 mg | ORAL_TABLET | ORAL | Status: DC | PRN

## 2013-05-12 MED ORDER — SODIUM CHLORIDE 0.9 % IJ SOLN
3.0000 mL | Freq: Two times a day (BID) | INTRAMUSCULAR | Status: DC
  Administered 2013-05-12: 3 mL via INTRAVENOUS

## 2013-05-12 NOTE — Discharge Instructions (Signed)
Atrial Fibrillation  Atrial fibrillation is a type of irregular heart rhythm (arrhythmia). During atrial fibrillation, the upper chambers of the heart (atria) quiver continuously in a chaotic pattern. This causes an irregular and often rapid heart rate.   Atrial fibrillation is the result of the heart becoming overloaded with disorganized signals that tell it to beat. These signals are normally released one at a time by a part of the right atrium called the sinoatrial node. They then travel from the atria to the lower chambers of the heart (ventricles), causing the atria and ventricles to contract and pump blood as they pass. In atrial fibrillation, parts of the atria outside of the sinoatrial node also release these signals. This results in two problems. First, the atria receive so many signals that they do not have time to fully contract. Second, the ventricles, which can only receive one signal at a time, beat irregularly and out of rhythm with the atria.   There are three types of atrial fibrillation:    Paroxysmal Paroxysmal atrial fibrillation starts suddenly and stops on its own within a week.    Persistent Persistent atrial fibrillation lasts for more than a week. It may stop on its own or with treatment.    Permanent Permanent atrial fibrillation does not go away. Episodes of atrial fibrillation may lead to permanent atrial fibrillation.   Atrial fibrillation can prevent your heart from pumping blood normally. It increases your risk of stroke and can lead to heart failure.   CAUSES    Heart conditions, including a heart attack, heart failure, coronary artery disease, and heart valve conditions.    Inflammation of the sac that surrounds the heart (pericarditis).    Blockage of an artery in the lungs (pulmonary embolism).    Pneumonia or other infections.    Chronic lung disease.    Thyroid problems, especially if the thyroid is overactive (hyperthyroidism).    Caffeine, excessive alcohol  use, and use of some illegal drugs.    Use of some medications, including certain decongestants and diet pills.    Heart surgery.    Birth defects.   Sometimes, no cause can be found. When this happens, the atrial fibrillation is called lone atrial fibrillation. The risk of complications from atrial fibrillation increases if you have lone atrial fibrillation and you are age 60 years or older.  RISK FACTORS   Heart failure.   Coronary artery disease   Diabetes mellitus.    High blood pressure (hypertension).    Obesity.    Other arrhythmias.    Increased age.  SYMPTOMS    A feeling that your heart is beating rapidly or irregularly.    A feeling of discomfort or pain in your chest.    Shortness of breath.    Sudden lightheadedness or weakness.    Getting tired easily when exercising.    Urinating more often than normal (mainly when atrial fibrillation first begins).   In paroxysmal atrial fibrillation, symptoms may start and suddenly stop.  DIAGNOSIS   Your caregiver may be able to detect atrial fibrillation when taking your pulse. Usually, testing is needed to diagnosis atrial fibrillation. Tests may include:    Electrocardiography. During this test, the electrical impulses of your heart are recorded while you are lying down.    Echocardiography. During echocardiography, sound waves are used to evaluate how blood flows through your heart.    Stress test. There is more than one type of stress test. If a stress test is   needed, ask your caregiver about which type is best for you.    Chest X-ray exam.    Blood tests.    Computed tomography (CT).   TREATMENT    Treating any underlying conditions. For example, if you have an overactive thyroid, treating the condition may correct atrial fibrillation.    Medication. Medications may be given to control a rapid heart rate or to prevent blood clots, heart failure, or a stroke.    Procedure to correct the rhythm of the  heart:   Electrical cardioversion. During electrical cardioversion, a controlled, low-energy shock is delivered to the heart through your skin. If you have chest pain, very low pressure blood pressure, or sudden heart failure, this procedure may need to be done as an emergency.   Catheter ablation. During this procedure, heart tissues that send the signals that cause atrial fibrillation are destroyed.   Maze or minimaze procedure. During this surgery, thin lines of heart tissue that carry the abnormal signals are destroyed. The maze procedure is an open-heart surgery. The minimaze procedure is a minimally invasive surgery. This means that small cuts are made to access the heart instead of a large opening.   Pulmonary venous isolation. During this surgery, tissue around the veins that carry blood from the lungs (pulmonary veins) is destroyed. This tissue is thought to carry the abnormal signals.  HOME CARE INSTRUCTIONS    Take medications as directed by your caregiver.   Only take medications that your caregiver approves. Some medications can make atrial fibrillation worse or recur.   If blood thinners were prescribed by your caregiver, take them exactly as directed. Too much can cause bleeding. Too little and you will not have the needed protection against stroke and other problems.   Perform blood tests at home if directed by your caregiver.   Perform blood tests exactly as directed.    Quit smoking if you smoke.    Do not drink alcohol.    Do not drink caffeinated beverages such as coffee, soda, and some teas. You may drink decaffeinated coffee, soda, or tea.    Maintain a healthy weight. Do not use diet pills unless your caregiver approves. They may make heart problems worse.    Follow diet instructions as directed by your caregiver.    Exercise regularly as directed by your caregiver.    Keep all follow-up appointments.  PREVENTION   The following substances can cause atrial fibrillation  to recur:    Caffeinated beverages.    Alcohol.    Certain medications, especially those used for breathing problems.    Certain herbs and herbal medications, such as those containing ephedra or ginseng.   Illegal drugs such as cocaine and amphetamines.  Sometimes medications are given to prevent atrial fibrillation from recurring. Proper treatment of any underlying condition is also important in helping prevent recurrence.   SEEK MEDICAL CARE IF:   You notice a change in the rate, rhythm, or strength of your heartbeat.    You suddenly begin urinating more frequently.    You tire more easily when exerting yourself or exercising.   SEEK IMMEDIATE MEDICAL CARE IF:    You develop chest pain, abdominal pain, sweating, or weakness.   You feel sick to your stomach (nauseous).   You develop shortness of breath.   You suddenly develop swollen feet and ankles.   You feel dizzy.   You face or limbs feel numb or weak.   There is a change in your   vision or speech.  MAKE SURE YOU:    Understand these instructions.   Will watch your condition.   Will get help right away if you are not doing well or get worse.  Document Released: 04/21/2005 Document Revised: 08/16/2012 Document Reviewed: 06/01/2012  Exit[employer] Patient Information 2014 [employer].

## 2013-05-12 NOTE — H&P (Signed)
History and Physical   Patient ID: DEVLYN RETTER MRN: [id number], DOB/AGE: 1980-08-08   Admit date: 05/11/2013 Date of Consult: 05/12/2013   Primary Physician: Default, Provider, MD Primary Cardiologist: Dr. Jens Som has seen patient once before in ED consultation in Nov. 2014.  HPI: Dylan Hoffman is a 33 y.o. male WM who was transferred from [facility] ED where he initially presented with abrupt onset palpitations around 4pm while swallowing some potatoes.  He spent several hours at the [facility] ED for AFib with RVR management and failed to convert to SR with Flecainide 300mg , metoprolol IV or Dilitiazem bolus+gtt.  His PMHx is notable for anxiety, daily marijuana use, smoking, ADHD and multiple episodes of PAF.  His last episode was 2 months ago to the [facility] ED during which he was converted with Flecainide and discharged.  He has also required DCCV in the past (2013 and Feb. 2014).  He states that he has AFib with RVR once every few years but this time it occurred after 2 months from the last episode.  He is very much aware of the symptoms of palpitations that he feels when he goes into AFib and is clear in stating that the time of onset was around 4pm today (05/11/13).  He continues to admit to smoking 1 pack of cigarettes every 4 days and daily marijuana use.  Upon transfer to [facility], he was in rate controlled AFib 70-80 bpm on Diltiazem gtt and felt much better although still feeling irregular heartbeats.  He received treatment dose LMWH x 1 at [facility] prior to transfer.  He remains compliant at home with aspirin 81mg  daily and metoprolol.   Problem List: Past Medical History  Diagnosis Date  . Anxiety   . Gonorrhea   . Afib   . Spontaneous pneumothorax ~ 2006  . GERD (gastroesophageal reflux disease)   . Daily headache   . Kidney stone ~ 2011    "passed it" (05/12/2013)  . ADHD (attention deficit hyperactivity disorder)     "dx'd as a teen; haven't taked RX in 15 years" (05/12/2013)     Past Surgical History  Procedure Laterality Date  . Percutaneous pinning phalanx fracture of hand Right ~ 2010     Allergies: No Known Allergies  Home Medications: Prior to Admission medications   Medication Sig Start Date End Date Taking? Authorizing Provider  aspirin 81 MG EC tablet Take 1 tablet (81 mg total) by mouth daily. 04/02/13  Yes Gerhard Munch, MD  metoprolol succinate (TOPROL XL) 50 MG 24 hr tablet Take 1 tablet (50 mg total) by mouth at bedtime. 04/02/13  Yes Gerhard Munch, MD    Inpatient Medications:   Prescriptions prior to admission  Medication Sig Dispense Refill  . aspirin 81 MG EC tablet Take 1 tablet (81 mg total) by mouth daily.  30 tablet  3  . metoprolol succinate (TOPROL XL) 50 MG 24 hr tablet Take 1 tablet (50 mg total) by mouth at bedtime.  30 tablet  3    History reviewed. No pertinent family history.   History   Social History  . Marital Status: Single    Spouse Name: N/A    Number of Children: N/A  . Years of Education: N/A   Occupational History  . 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    Social History Main Topics  . Smoking status: Current Every Day Smoker -- 0.25 packs/day for 19 years    Types: Cigarettes  . Smokeless  tobacco: Never Used  . Alcohol Use: Yes     Comment: 05/12/2013 "drink a couple beers/month"  . Drug Use: 1.00 per week    Special: Marijuana     Comment: 05/12/2013 "smoke marijuana q d; last time was am 1/7"  . Sexual Activity: Not Currently   Other Topics Concern  . Not on file   Social History Narrative   Previously a Theme park manager, now a Engineer, mining for [employer].      Review of Systems: All other systems reviewed and are otherwise negative except as noted above.  Physical Exam: Blood pressure 104/72, pulse 80, temperature 97.9 F (36.6 C), temperature source Oral, resp. rate 18, height 6\' 2"  (1.88 m), weight 74.9 kg (165 lb 2 oz), SpO2 99.00%.  General: Well developed, well nourished, in no  acute distress. Head: Normocephalic, atraumatic, sclera non-icteric, no xanthomas, nares are without discharge.  Neck: Negative for carotid bruits. JVD not elevated. Lungs: Clear bilaterally to auscultation without wheezes, rales, or rhonchi. Breathing is unlabored. Heart:  Irregularly irregular rate and rhythm, + S1 +S2. No murmurs, rubs, or gallops appreciated. Abdomen: Soft, non-tender, non-distended with normoactive bowel sounds. No hepatomegaly. No rebound/guarding. No obvious abdominal masses. Msk:  Strength and tone appears normal for age. Extremities: No clubbing, cyanosis or edema.  Distal pedal pulses are 2+ and equal bilaterally. Neuro: Alert and oriented X 3. Moves all extremities spontaneously. Psych:  Responds to questions appropriately with a normal affect.  Labs: No results found for this basename: WBC, HGB, HCT, MCV, PLT,  in the last 72 hours No results found for this basename: VITAMINB12, FOLATE, FERRITIN, TIBC, IRON, RETICCTPCT,  in the last 72 hours No results found for this basename: DDIMER,  in the last 72 hours No results found for this basename: NA, K, CL, CO2, BUN, CREATININE, CALCIUM, LABALBU, PROT, BILITOT, ALKPHOS, ALT, AST, AMYLASE, LIPASE, GLUCOSE,  in the last 168 hours No results found for this basename: HGBA1C,  in the last 72 hours No results found for this basename: CKTOTAL, CKMB, CKMBINDEX, TROPONINI,  in the last 72 hours No components found with this basename: POCBNP,  No results found for this basename: CHOL, HDL, LDLCALC, TRIG, CHOLHDL, LDLDIRECT,  in the last 72 hours No results found for this basename: TSH, T4TOTAL, FREET3, T3FREE, THYROIDAB,  in the last 72 hours  Radiology/Studies: No results found.  05/11/13 12-lead ECG:  Atrial fibrillation with RVR, normal QTc.  No ST-T wave abnormalities.  ASSESSMENT:  33 yo WM with anxiety, ADHD, smoker, daily marijuana use and known PAF (structurally normal heart per 2014 echo) who presents with recurrence of  AFib with RVR.  AFib episodes are becoming more frequent (second episode in last 2 months).  He noticed the onset abruptly at approximately 4pm on 05/11/13.  He has failed to convert to SR with Flecainide 300mg  and Diltiazem infusion both given at OSH prior to transfer to [facility].  PLAN:  1-NPO after midnight for likely need of DCCV in AM. 2-Routine labs including PT/INR. 3-Continue LMWH (treatment dose) 1mg /kg q12 hours given that AF is becoming more frequent and attending staff will discuss risks versus benefits of short-term therapeutic anticoagulation in this patient with more frequent AF episodes.  Alternatively, aspirin monotherapy may also be continued. 4-Patient will likely need initiation of flecainide for AAD therapy after DCCV and prior to discharge. 5-Continue other home medications. 6-He was counseled on smoking and marijuana cessation.  Code Status:  FULL CODE.  Signed, Christie Nottingham, MD [facility]  [facility]  05/12/2013, 1:47 AM

## 2013-05-12 NOTE — Progress Notes (Signed)
Pt discharged to home per MD order. Pt received and reviewed all discharge instructions and medication information including follow-up appointments and prescription information. Pt verbalized understanding. Pt IV and telemetry monitor removed prior to discharge. Pt alert and oriented at discharge with no complaints of pain. Pt ambulated to private vehicle per pt request. [name], [name] C

## 2013-05-12 NOTE — Progress Notes (Signed)
ANTICOAGULATION CONSULT NOTE - Initial Consult  Pharmacy Consult for Lovenox Indication: atrial fibrillation  No Known Allergies  Patient Measurements: Height: 6\' 2"  (188 cm) Weight: 165 lb 2 oz (74.9 kg) IBW/kg (Calculated) : 82.2  Vital Signs: Temp: 97.9 F (36.6 C) (01/08 0026) Temp src: Oral (01/07 2316) BP: 104/72 mmHg (01/08 0026) Pulse Rate: 80 (01/08 0026)   Estimated Creatinine Clearance: 132.2 ml/min (by C-G formula based on Cr of 0.85).   Medical History: Past Medical History  Diagnosis Date  . Anxiety   . Gonorrhea   . Afib   . Spontaneous pneumothorax ~ 2006  . GERD (gastroesophageal reflux disease)   . Daily headache   . Kidney stone ~ 2011    "passed it" (05/12/2013)  . ADHD (attention deficit hyperactivity disorder)     "dx'd as a teen; haven't taked RX in 15 years" (05/12/2013)    Medications:  Prescriptions prior to admission  Medication Sig Dispense Refill  . aspirin 81 MG EC tablet Take 1 tablet (81 mg total) by mouth daily.  30 tablet  3  . metoprolol succinate (TOPROL XL) 50 MG 24 hr tablet Take 1 tablet (50 mg total) by mouth at bedtime.  30 tablet  3   Scheduled:  . enoxaparin (LOVENOX) injection  75 mg Subcutaneous Q12H  . sodium chloride  3 mL Intravenous Q12H   Infusions:  . diltiazem (CARDIZEM) infusion      Assessment: 33yo male c/o flutter in chest, has h/o Afib off/on x5012yr, not on anticoag at home (CHADS2 = 0), found to be in Afib w/ RVR, noted to be occurring more frequently, admitted for cardioversion, to begin Lovenox.  Goal of Therapy:  Anti-Xa level 0.6-1 units/ml 4hrs after LMWH dose given Monitor platelets by anticoagulation protocol: Yes   Plan:  Will begin Lovenox 75mg  SQ Q12H and monitor CBC.  Vernard GamblesVeronda Shakendra Griffeth, PharmD, BCPS  05/12/2013,2:17 AM

## 2013-05-12 NOTE — Progress Notes (Signed)
Subjective:  Converted to sinus rhythm. No palpitation. Admits to non-complaince on medications. Afebrile.  Objective:  Vital Signs in the last 24 hours: Temp:  [97.6 F (36.4 C)-98.2 F (36.8 C)] 97.6 F (36.4 C) (01/08 0422) Pulse Rate:  [65-146] 85 (01/08 0422) Cardiac Rhythm:  [-] Atrial fibrillation (01/08 0803) Resp:  [12-23] 18 (01/08 0422) BP: (92-119)/(57-96) 118/78 mmHg (01/08 0422) SpO2:  [95 %-100 %] 97 % (01/08 0422) Weight:  [74.844 kg (165 lb)-74.9 kg (165 lb 2 oz)] 74.9 kg (165 lb 2 oz) (01/08 0026)  Physical Exam: BP Readings from Last 1 Encounters:  05/12/13 118/78     Wt Readings from Last 1 Encounters:  05/12/13 74.9 kg (165 lb 2 oz)    Weight change:   HEENT: [place]/AT, Eyes- PERL, EOMI, Conjunctiva-Pink, Sclera-Non-icteric Neck: No JVD, No bruit, Trachea midline. Lungs:  Clear, Bilateral. Cardiac:  Regular rhythm, normal S1 and S2, no S3.  Abdomen:  Soft, non-tender. Extremities:  No edema present. No cyanosis. No clubbing. CNS: AxOx3, Cranial nerves grossly intact, moves all 4 extremities. Right handed. Skin: Warm and dry.   Intake/Output from previous day:      Lab Results: BMET    Component Value Date/Time   NA 144 05/12/2013 0524   K 3.8 05/12/2013 0524   CL 105 05/12/2013 0524   CO2 28 05/12/2013 0524   GLUCOSE 93 05/12/2013 0524   BUN 10 05/12/2013 0524   CREATININE 0.92 05/12/2013 0524   CALCIUM 8.9 05/12/2013 0524   GFRNONAA >90 05/12/2013 0524   GFRAA >90 05/12/2013 0524   CBC    Component Value Date/Time   WBC 5.5 05/12/2013 0524   RBC 4.43 05/12/2013 0524   HGB 13.8 05/12/2013 0524   HCT 40.4 05/12/2013 0524   PLT 162 05/12/2013 0524   MCV 91.2 05/12/2013 0524   MCH 31.2 05/12/2013 0524   MCHC 34.2 05/12/2013 0524   RDW 12.5 05/12/2013 0524   LYMPHSABS 1.3 04/02/2013 1452   MONOABS 0.3 04/02/2013 1452   EOSABS 0.1 04/02/2013 1452   BASOSABS 0.0 04/02/2013 1452   CARDIAC ENZYMES No results found for this basename: CKTOTAL, CKMB, CKMBINDEX, TROPONINI     Scheduled Meds: . diltiazem  120 mg Oral Daily  . enoxaparin (LOVENOX) injection  75 mg Subcutaneous Q12H  . metoprolol tartrate  25 mg Oral BID  . sodium chloride  3 mL Intravenous Q12H   Continuous Infusions: . diltiazem (CARDIZEM) infusion     PRN Meds:.sodium chloride, acetaminophen, ondansetron (ZOFRAN) IV, sodium chloride  Assessment/Plan:  Atrial fibrillation with rapid ventricular response Anxiety Tobacco use disorder Marijuana use disorder  Resume metoprolol and diltiazem with aspirin. Resume diet. Increase activity. Home soon. F/U Dr. Moises Bloodrenshaw-Patient to call office for appointment in 1-2 weeks.   LOS: 1 day    Orpah CobbAjay Leobardo Granlund  MD  05/12/2013, 1:25 PM

## 2013-05-12 NOTE — Discharge Summary (Signed)
Physician Discharge Summary  Patient ID: Dylan LevinsDevin J Hoffman MRN: [id number] DOB/AGE: 33/12/1980 32 y.o.  Admit date: 05/11/2013 Discharge date: 05/12/2013  Admission Diagnoses: Atrial fibrillation with rapid ventricular response  Anxiety  Tobacco use disorder  Marijuana use disorder  Discharge Diagnoses:  Principle Problem: * Atrial fibrillation with rapid ventricular response * Medicine non-compliance  Anxiety  Tobacco use disorder  Marijuana use disorder  Discharged Condition: good  Hospital Course: 33 year old male came in with atrial fibrillation with rapid ventricular response. He was treated with IV metoprolol and diltiazem and converted to sinus rhythm. He was prescription of oral diltiazem and metoprolol He will be [facility]  Consults: cardiology  Significant Diagnostic Studies: labs: Normal CBC and BMET.  Treatments: cardiac meds: metoprolol and diltiazem  Discharge Exam: Blood pressure 114/69, pulse 72, temperature 97.9 F (36.6 C), temperature source Oral, resp. rate 16, height 6\' 2"  (1.88 m), weight 74.9 kg (165 lb 2 oz), SpO2 100.00%.  HEENT: /AT, Eyes- PERL, EOMI, Conjunctiva-Pink, Sclera-Non-icteric  Neck: No JVD, No bruit, Trachea midline.  Lungs: Clear, Bilateral.  Cardiac: Regular rhythm, normal S1 and S2, no S3.  Abdomen: Soft, non-tender.  Extremities: No edema present. No cyanosis. No clubbing.  CNS: AxOx3, Cranial nerves grossly intact, moves all 4 extremities. Right handed.  Skin: Warm and dry.  Disposition: 01-Home or Self Care     Medication List    STOP taking these medications       metoprolol succinate 50 MG 24 hr tablet  Commonly known as:  TOPROL XL      TAKE these medications       aspirin 81 MG EC tablet  Take 1 tablet (81 mg total) by mouth daily.     diltiazem 120 MG 24 hr capsule  Commonly known as:  CARDIZEM CD  Take 1 capsule (120 mg total) by mouth daily.     metoprolol tartrate  25 MG tablet  Commonly known as:  LOPRESSOR  Take 1 tablet (25 mg total) by mouth 2 (two) times daily.           Follow-up Information   Follow up with Olga MillersBrian Crenshaw, MD. Schedule an appointment as soon as possible for a visit in 1 week.   Specialty:  Cardiology   Contact information:   [address] [place] [address] [phone]       Follow up with Ricki RodriguezKADAKIA,Chrisma Hurlock S, MD. (As needed)    Specialty:  Cardiology   Contact information:   [address] [place] [address] [phone]       Signed: Ricki RodriguezKADAKIA,Oswin Griffith S 05/12/2013, 6:53 PM

## 2013-05-12 NOTE — Progress Notes (Signed)
Pt noted to be in NSR. Monitor tech Annice PihJackie called, stated pt converted spontaneously from atrial fibrilation to sinus rhythm at 11:19 am.  No notification received. MD notified. Orders received to give PO cardizem and discontinue IV cardizem 2 hours after administration.  Will continue to monitor. [name], Gwendolynn Merkey C

## 2013-11-05 ENCOUNTER — Emergency Department (HOSPITAL_COMMUNITY)
Admission: EM | Admit: 2013-11-05 | Discharge: 2013-11-05 | Disposition: A | Discharge status: Home / Self Care | Payer: Self-pay | Attending: Emergency Medicine | Admitting: Emergency Medicine

## 2013-11-05 ENCOUNTER — Encounter (HOSPITAL_COMMUNITY): Payer: Self-pay | Admitting: Emergency Medicine

## 2013-11-05 DIAGNOSIS — Z8719 Personal history of other diseases of the digestive system: Secondary | ICD-10-CM | POA: Insufficient documentation

## 2013-11-05 DIAGNOSIS — Z8659 Personal history of other mental and behavioral disorders: Secondary | ICD-10-CM | POA: Insufficient documentation

## 2013-11-05 DIAGNOSIS — Z792 Long term (current) use of antibiotics: Secondary | ICD-10-CM | POA: Insufficient documentation

## 2013-11-05 DIAGNOSIS — I4891 Unspecified atrial fibrillation: Secondary | ICD-10-CM | POA: Insufficient documentation

## 2013-11-05 DIAGNOSIS — Z7982 Long term (current) use of aspirin: Secondary | ICD-10-CM | POA: Insufficient documentation

## 2013-11-05 DIAGNOSIS — Z79899 Other long term (current) drug therapy: Secondary | ICD-10-CM | POA: Insufficient documentation

## 2013-11-05 DIAGNOSIS — J029 Acute pharyngitis, unspecified: Secondary | ICD-10-CM

## 2013-11-05 DIAGNOSIS — Z87442 Personal history of urinary calculi: Secondary | ICD-10-CM | POA: Insufficient documentation

## 2013-11-05 DIAGNOSIS — F17200 Nicotine dependence, unspecified, uncomplicated: Secondary | ICD-10-CM | POA: Insufficient documentation

## 2013-11-05 DIAGNOSIS — Z8619 Personal history of other infectious and parasitic diseases: Secondary | ICD-10-CM | POA: Insufficient documentation

## 2013-11-05 MED ORDER — PENICILLIN V POTASSIUM 500 MG PO TABS: 500.0000 mg | ORAL_TABLET | Freq: Three times a day (TID) | ORAL | Status: DC

## 2013-11-05 NOTE — ED Notes (Signed)
Pt c/o sore throat x 2 days. Pt reports history of strep throat in the past.

## 2013-11-05 NOTE — Discharge Instructions (Signed)

## 2013-11-05 NOTE — ED Provider Notes (Signed)
CSN: [id number]     Arrival date & time 11/05/13  1350 History  This chart was scribed for non-physician practitioner Dylan Peliffany Kraven Calk, PA-C, working with Hurman HornJohn M Bednar, MD, by Yevette EdwardsAngela Bracken, ED Scribe. This patient was seen in room TR05C/TR05C and the patient's care was started at 1:58 PM.  None    Chief Complaint  Patient presents with  . Sore Throat    The history is provided by the patient. No language interpreter was used.   HPI Comments: Dylan Hoffman is a 33 y.o. male who presents to the Emergency Department complaining of a sore throat, worse on the right, which has been present for two days and which is increased with swallowing. He denies fever, chills, or emesis. He also denies treating the symptoms with any OTC medication. He reports a h/o strep throats and states the present symptoms are similar to previous strep throats.  The pt is a current smoker.   Past Medical History  Diagnosis Date  . Anxiety   . Gonorrhea   . Afib   . Spontaneous pneumothorax ~ 2006  . GERD (gastroesophageal reflux disease)   . Daily headache   . Kidney stone ~ 2011    "passed it" (05/12/2013)  . ADHD (attention deficit hyperactivity disorder)     "dx'd as a teen; haven't taked RX in 15 years" (05/12/2013)   Past Surgical History  Procedure Laterality Date  . Percutaneous pinning phalanx fracture of hand Right ~ 2010   No family history on file. History  Substance Use Topics  . Smoking status: Current Every Day Smoker -- 0.25 packs/day for 19 years    Types: Cigarettes  . Smokeless tobacco: Never Used  . Alcohol Use: Yes     Comment: occassional    Review of Systems  Constitutional: Negative for fever and chills.  HENT: Positive for sore throat.   Gastrointestinal: Negative for vomiting.  All other systems reviewed and are negative.   Allergies  Review of patient's allergies indicates no known allergies.  Home Medications   Prior to Admission medications   Medication Sig Start  Date End Date Taking? Authorizing Provider  aspirin 81 MG EC tablet Take 1 tablet (81 mg total) by mouth daily. 04/02/13   Gerhard Munchobert Lockwood, MD  diltiazem (CARDIZEM CD) 120 MG 24 hr capsule Take 1 capsule (120 mg total) by mouth daily. 05/12/13   Ricki RodriguezAjay S [name], MD  metoprolol tartrate (LOPRESSOR) 25 MG tablet Take 1 tablet (25 mg total) by mouth 2 (two) times daily. 05/12/13   Ricki RodriguezAjay S [name], MD  penicillin v potassium (VEETID) 500 MG tablet Take 1 tablet (500 mg total) by mouth 3 (three) times daily. 11/05/13   Dorthula Matasiffany G Josua Ferrebee, PA-C   Triage Vitals: BP 120/81  Pulse 67  Temp(Src) 98.5 F (36.9 C) (Oral)  Resp 18  Ht 6\' 3"  (1.905 m)  Wt 160 lb (72.576 kg)  BMI 20.00 kg/m2  SpO2 100%  Physical Exam  Nursing note and vitals reviewed. Constitutional: He is oriented to person, place, and time. He appears well-developed and well-nourished. No distress.  HENT:  Head: Normocephalic and atraumatic.  Right Ear: Tympanic membrane, external ear and ear canal normal.  Left Ear: Tympanic membrane, external ear and ear canal normal.  Nose: Nose normal. No rhinorrhea. Right sinus exhibits no maxillary sinus tenderness and no frontal sinus tenderness. Left sinus exhibits no maxillary sinus tenderness and no frontal sinus tenderness.  Mouth/Throat: Uvula is midline and mucous membranes are normal. No  trismus in the jaw. Normal dentition. No dental abscesses or uvula swelling. Posterior oropharyngeal edema (on the right. no Trismus and no abscess noted.) present. No posterior oropharyngeal erythema or tonsillar abscesses.  No submental edema, tongue not elevated, no trismus. No impending airway obstruction; Pt able to speak full sentences, swallow intact, no drooling, stridor, or tonsillar/uvula displacement. No palatal petechia  Eyes: Conjunctivae are normal.  Neck: Trachea normal, normal range of motion and full passive range of motion without pain. Neck supple. No rigidity. Normal range of motion present. No  Brudzinski's sign noted.  Flexion and extension of neck without pain or difficulty. Able to breath without difficulty in extension.  Cardiovascular: Normal rate and regular rhythm.   Pulmonary/Chest: Effort normal and breath sounds normal. No stridor. No respiratory distress. He has no wheezes.  Abdominal: Soft. There is no tenderness.  No obvious evidence of splenomegaly. Non ttp.   Musculoskeletal: Normal range of motion.  Lymphadenopathy:       Head (right side): No preauricular and no posterior auricular adenopathy present.       Head (left side): No preauricular and no posterior auricular adenopathy present.    He has cervical adenopathy.  Neurological: He is alert and oriented to person, place, and time.  Skin: Skin is warm and dry. No rash noted. He is not diaphoretic.  Psychiatric: He has a normal mood and affect.    ED Course  Procedures (including critical care time)  DIAGNOSTIC STUDIES: Oxygen Saturation is 100% on room air, normal by my interpretation.    COORDINATION OF CARE:  2:10 PM- Discussed treatment plan with patient, and the patient agreed to the plan. The plan includes a prescription for antibiotics.   Labs Review Labs Reviewed - No data to display  Imaging Review No results found.   EKG Interpretation None      MDM   Final diagnoses:  Pharyngitis    Pain is not consistent with tonsillar abscess but due to the pain lateralizing I have concern for one possibly developing. Will give abx.  32 y.o.[name]'s evaluation in the Emergency Department is complete. It has been determined that no acute conditions requiring further emergency intervention are present at this time. The patient/guardian have been advised of the diagnosis and plan. We have discussed signs and symptoms that warrant return to the ED, such as changes or worsening in symptoms.  Vital signs are stable at discharge. Filed Vitals:   11/05/13 1354  BP: 120/81  Pulse: 67  Temp:  98.5 F (36.9 C)  Resp: 18    Patient/guardian has voiced understanding and agreed to follow-up with the PCP or specialist.   I personally performed the services described in this documentation, which was scribed in my presence. The recorded information has been reviewed and is accurate.    Dorthula Matasiffany G Jolanda Mccann, PA-C 11/05/13 1423

## 2013-11-05 NOTE — ED Provider Notes (Signed)
Medical screening examination/treatment/procedure(s) were performed by non-physician practitioner and as supervising physician I was immediately available for consultation/collaboration.   EKG Interpretation None       Paisley Grajeda M Yailin Biederman, MD 11/05/13 1850 

## 2013-11-05 NOTE — ED Notes (Signed)
Discharge with instructions and RX using the teach back method.

## 2014-03-06 ENCOUNTER — Emergency Department (HOSPITAL_COMMUNITY): Payer: Self-pay

## 2014-03-06 ENCOUNTER — Encounter (HOSPITAL_COMMUNITY): Payer: Self-pay | Admitting: *Deleted

## 2014-03-06 ENCOUNTER — Observation Stay (HOSPITAL_COMMUNITY)
Admission: EM | Admit: 2014-03-06 | Discharge: 2014-03-08 | Disposition: A | Discharge status: Home / Self Care | Payer: Self-pay | Attending: Cardiology | Admitting: Cardiology

## 2014-03-06 DIAGNOSIS — F1410 Cocaine abuse, uncomplicated: Secondary | ICD-10-CM

## 2014-03-06 DIAGNOSIS — I480 Paroxysmal atrial fibrillation: Principal | ICD-10-CM | POA: Insufficient documentation

## 2014-03-06 DIAGNOSIS — R002 Palpitations: Secondary | ICD-10-CM

## 2014-03-06 DIAGNOSIS — F17210 Nicotine dependence, cigarettes, uncomplicated: Secondary | ICD-10-CM | POA: Insufficient documentation

## 2014-03-06 DIAGNOSIS — I4891 Unspecified atrial fibrillation: Secondary | ICD-10-CM

## 2014-03-06 DIAGNOSIS — F1110 Opioid abuse, uncomplicated: Secondary | ICD-10-CM | POA: Insufficient documentation

## 2014-03-06 DIAGNOSIS — F1210 Cannabis abuse, uncomplicated: Secondary | ICD-10-CM | POA: Insufficient documentation

## 2014-03-06 DIAGNOSIS — Z720 Tobacco use: Secondary | ICD-10-CM

## 2014-03-06 DIAGNOSIS — Z9114 Patient's other noncompliance with medication regimen: Secondary | ICD-10-CM | POA: Insufficient documentation

## 2014-03-06 DIAGNOSIS — Z7982 Long term (current) use of aspirin: Secondary | ICD-10-CM | POA: Insufficient documentation

## 2014-03-06 DIAGNOSIS — F1910 Other psychoactive substance abuse, uncomplicated: Secondary | ICD-10-CM

## 2014-03-06 LAB — CBC
HCT: 41.1 % (ref 39.0–52.0)
HEMOGLOBIN: 14.4 g/dL (ref 13.0–17.0)
MCH: 31.9 pg (ref 26.0–34.0)
MCHC: 35 g/dL (ref 30.0–36.0)
MCV: 90.9 fL (ref 78.0–100.0)
Platelets: 178 10*3/uL (ref 150–400)
RBC: 4.52 MIL/uL (ref 4.22–5.81)
RDW: 12.9 % (ref 11.5–15.5)
WBC: 5.5 10*3/uL (ref 4.0–10.5)

## 2014-03-06 LAB — BASIC METABOLIC PANEL
ANION GAP: 11 (ref 5–15)
BUN: 13 mg/dL (ref 6–23)
CO2: 28 mEq/L (ref 19–32)
Calcium: 9.1 mg/dL (ref 8.4–10.5)
Chloride: 102 mEq/L (ref 96–112)
Creatinine, Ser: 0.84 mg/dL (ref 0.50–1.35)
GLUCOSE: 98 mg/dL (ref 70–99)
POTASSIUM: 4.2 meq/L (ref 3.7–5.3)
SODIUM: 141 meq/L (ref 137–147)

## 2014-03-06 LAB — RAPID URINE DRUG SCREEN, HOSP PERFORMED
Amphetamines: NOT DETECTED
BENZODIAZEPINES: NOT DETECTED
Barbiturates: NOT DETECTED
Cocaine: POSITIVE — AB
Opiates: POSITIVE — AB
Tetrahydrocannabinol: POSITIVE — AB

## 2014-03-06 LAB — I-STAT TROPONIN, ED: TROPONIN I, POC: 0.02 ng/mL (ref 0.00–0.08)

## 2014-03-06 MED ORDER — DEXTROSE 5 % IV SOLN
5.0000 mg/h | INTRAVENOUS | Status: DC
  Administered 2014-03-06: 5 mg/h via INTRAVENOUS

## 2014-03-06 MED ORDER — DILTIAZEM LOAD VIA INFUSION
10.0000 mg | Freq: Once | INTRAVENOUS | Status: AC
  Administered 2014-03-06: 10 mg via INTRAVENOUS
  Filled 2014-03-06: qty 10

## 2014-03-06 NOTE — ED Notes (Signed)
Pt in c/o palpitations and generalized weakness, states he has a history of afib and this feels the same, hasn't been able to get his medication for the last 6 months, symptoms started Saturday night, reports shortness of breath with exertion, denies other symptoms

## 2014-03-06 NOTE — ED Provider Notes (Signed)
CSN: [id number]     Arrival date & time 03/06/14  2033 History   First MD Initiated Contact with Patient 03/06/14 2048     Chief Complaint  Patient presents with  . Palpitations     (Consider location/radiation/quality/duration/timing/severity/associated sxs/prior Treatment) HPI Comments: Patient with history of paroxysmal atrial fibrillation presenting with palpitations that onset 2 nights ago. Palpitations have been constant. No chest pain but does have some shortness of breath with exertion. States he has been out of his rate control medications for the past 6 months. Does not take any anticoagulation. No abdominal pain, nausea or vomiting. Denies any dizziness or lightheadedness. No focal weakness, numbness or tingling. Admits to marijuana use.  Patient is a 33 y.o. male presenting with palpitations. The history is provided by the patient.  Palpitations Associated symptoms: shortness of breath   Associated symptoms: no chest pain, no cough, no dizziness, no nausea and no vomiting     Past Medical History  Diagnosis Date  . Anxiety   . Gonorrhea   . Afib   . Spontaneous pneumothorax ~ 2006  . GERD (gastroesophageal reflux disease)   . Daily headache   . Kidney stone ~ 2011    "passed it" (05/12/2013)  . ADHD (attention deficit hyperactivity disorder)     "dx'd as a teen; haven't taked RX in 15 years" (05/12/2013)   Past Surgical History  Procedure Laterality Date  . Percutaneous pinning phalanx fracture of hand Right ~ 2010   History reviewed. No pertinent family history. History  Substance Use Topics  . Smoking status: Current Every Day Smoker -- 0.25 packs/day for 19 years    Types: Cigarettes  . Smokeless tobacco: Never Used  . Alcohol Use: Yes     Comment: occassional    Review of Systems  Constitutional: Negative for fever, activity change and appetite change.  HENT: Negative for congestion.   Respiratory: Positive for shortness of breath. Negative for cough and  chest tightness.   Cardiovascular: Positive for palpitations. Negative for chest pain.  Gastrointestinal: Negative for nausea, vomiting and abdominal pain.  Genitourinary: Negative for dysuria and hematuria.  Musculoskeletal: Negative for myalgias and arthralgias.  Skin: Negative for rash.  Neurological: Positive for weakness. Negative for dizziness and headaches.  A complete 10 system review of systems was obtained and all systems are negative except as noted in the HPI and PMH.      Allergies  Review of patient's allergies indicates no known allergies.  Home Medications   Prior to Admission medications   Medication Sig Start Date End Date Taking? Authorizing Provider  aspirin 81 MG EC tablet Take 1 tablet (81 mg total) by mouth daily. 04/02/13  Yes Gerhard Munchobert Lockwood, MD  diltiazem (CARDIZEM CD) 120 MG 24 hr capsule Take 1 capsule (120 mg total) by mouth daily. 05/12/13   Ricki RodriguezAjay S [name], MD  metoprolol tartrate (LOPRESSOR) 25 MG tablet Take 1 tablet (25 mg total) by mouth 2 (two) times daily. 05/12/13   Ricki RodriguezAjay S [name], MD  penicillin v potassium (VEETID) 500 MG tablet Take 1 tablet (500 mg total) by mouth 3 (three) times daily. 11/05/13   Tiffany Irine SealG Greene, PA-C   BP 108/74 mmHg  Pulse 91  Temp(Src) 98.2 F (36.8 C) (Oral)  Resp 14  Ht 6\' 3"  (1.905 m)  Wt 160 lb (72.576 kg)  BMI 20.00 kg/m2  SpO2 99% Physical Exam  Constitutional: He is oriented to person, place, and time. He appears well-developed and well-nourished. No distress.  HENT:  Head: Normocephalic and atraumatic.  Mouth/Throat: Oropharynx is clear and moist. No oropharyngeal exudate.  Eyes: Conjunctivae and EOM are normal. Pupils are equal, round, and reactive to light.  Neck: Normal range of motion. Neck supple.  No meningismus.  Cardiovascular: Normal rate, normal heart sounds and intact distal pulses.   No murmur heard. Irregular tachycardia  Pulmonary/Chest: Effort normal and breath sounds normal. No respiratory  distress.  Abdominal: Soft. There is no tenderness. There is no rebound and no guarding.  Musculoskeletal: Normal range of motion. He exhibits no edema or tenderness.  Neurological: He is alert and oriented to person, place, and time. No cranial nerve deficit. He exhibits normal muscle tone. Coordination normal.  No ataxia on finger to nose bilaterally. No pronator drift. 5/5 strength throughout. CN 2-12 intact. Negative Romberg. Equal grip strength. Sensation intact. Gait is normal.   Skin: Skin is warm.  Psychiatric: He has a normal mood and affect. His behavior is normal.  Nursing note and vitals reviewed.   ED Course  Procedures (including critical care time) Labs Review Labs Reviewed  URINE RAPID DRUG SCREEN (HOSP PERFORMED) - Abnormal; Notable for the following:    Opiates POSITIVE (*)    Cocaine POSITIVE (*)    Tetrahydrocannabinol POSITIVE (*)    All other components within normal limits  CBC  BASIC METABOLIC PANEL  I-STAT TROPOININ, ED    Imaging Review Dg Chest 2 View  03/06/2014   CLINICAL DATA:  Acute Palpitations and shortness of breath.  EXAM: CHEST  2 VIEW  COMPARISON:  04/02/2013  FINDINGS: The heart size and mediastinal contours are within normal limits. Both lungs are clear. The visualized skeletal structures are unremarkable.  IMPRESSION: Normal chest x-ray.   Electronically Signed   By: Loralie ChampagneMark  Gallerani M.D.   On: 03/06/2014 22:45     EKG Interpretation   Date/Time:  Monday March 06 2014 23:26:07 EST Ventricular Rate:  70 PR Interval:    QRS Duration: 90 QT Interval:  364 QTC Calculation: 393 R Axis:   175 Text Interpretation:  Atrial fibrillation Anteroseptal infarct, age  indeterminate Lateral leads are also involved Confirmed by Manus GunningANCOUR  MD,  Jaleia Hanke [phone]) on 03/06/2014 11:34:57 PM      MDM   Final diagnoses:  Palpitation   Atrial fibrillation with rvr, noncompliance with medications. No chest pain or SOB.  HR 140s.  BP stable.  Cardizem  gtt started.  HR improved to 90s, remains in a fib. D/w Dr. Algie CofferKadakia.  States he is no longer his patient.  Defers to [facility] and Dr. Jens Somrenshaw.  D/w Dr. Adolm JosephWhitlock who will admit.  Cocain positive which patient initially denied.    Date: 03/06/2014  Rate: 132  Rhythm: atrial fibrillation  QRS Axis: normal  Intervals: normal  ST/T Wave abnormalities: nonspecific ST/T changes  Conduction Disutrbances:none  Narrative Interpretation:   Old EKG Reviewed: unchanged  CRITICAL CARE Performed by: Glynn OctaveANCOUR, Emilyanne Mcgough Total critical care time: 30 Critical care time was exclusive of separately billable procedures and treating other patients. Critical care was necessary to treat or prevent imminent or life-threatening deterioration. Critical care was time spent personally by me on the following activities: development of treatment plan with patient and/or surrogate as well as nursing, discussions with consultants, evaluation of patient's response to treatment, examination of patient, obtaining history from patient or surrogate, ordering and performing treatments and interventions, ordering and review of laboratory studies, ordering and review of radiographic studies, pulse oximetry and re-evaluation of patient's condition.  Glynn Octave, MD 03/07/14 [phone]

## 2014-03-06 NOTE — H&P (Addendum)
History and Physical  Patient ID: Dylan LevinsDevin J Hoffman MRN: [id number], SOB: 07/26/1980 33 y.o. Date of Encounter: 03/06/2014, 11:26 PM  Primary Physician: No PCP Per Patient Primary Cardiologist: He says Dylan Hoffman but according to ER, Dylan Hoffman says Dylan Hoffman  Chief Complaint: palpitations  HPI: 33 y.o. male w/ PMHx significant for afib dxd 10 yrs ago, tobacco abuse who [facility] with complaints of palpitations. Started on Saturday at around 5:00 pm (now > 48 hrs) while eating dinner- ate a big bite and had a painful swallow and it converted at that time. [place] [facility] [place] which [place] what [place] his presentation. Previously on dilt and metop but have not taken these in 7 months due to finances (unable to followup as outpt to get RX). No red flag symptoms of chest pain, syncope, presyncope. Endorses decreased exercise tolerance.  Denies ETOH. Smokes MJ and tobacco. Denies cocaine, amphetamines, or heroin.   Upon presentation to the ER, was in the 140s. With dilt bolus and gtt, rates have improved to 90s. Currently at 5 mg/hr.  EKG did not transfer from triage. Telemetry is clearly atrial fib.Dylan Hoffman. CXR was without acute cardiopulmonary abnormalities. Labs are wnl.  Echo from 2014 demonstrated nl LVEF, mtiral leaflet bowing but no frank prolapse.   Past Medical History  Diagnosis Date  . Anxiety   . Gonorrhea   . Afib   . Spontaneous pneumothorax ~ 2006  . GERD (gastroesophageal reflux disease)   . Daily headache   . Kidney stone ~ 2011    "passed it" (05/12/2013)  . ADHD (attention deficit hyperactivity disorder)     "dx'd as a teen; haven't taked RX in 15 years" (05/12/2013)     Surgical History:  Past Surgical History  Procedure Laterality Date  . Percutaneous pinning phalanx fracture of hand Right ~ 2010     Home Meds: Prior to Admission medications   Medication Sig Start Date End Date Taking? Authorizing Provider  aspirin 81 MG EC tablet Take 1 tablet  (81 mg total) by mouth daily. 04/02/13  Yes Gerhard Munchobert Lockwood, MD  diltiazem (CARDIZEM CD) 120 MG 24 hr capsule Take 1 capsule (120 mg total) by mouth daily. 05/12/13   Ricki RodriguezAjay S [name], MD  metoprolol tartrate (LOPRESSOR) 25 MG tablet Take 1 tablet (25 mg total) by mouth 2 (two) times daily. 05/12/13   Ricki RodriguezAjay S [name], MD  penicillin v potassium (VEETID) 500 MG tablet Take 1 tablet (500 mg total) by mouth 3 (three) times daily. 11/05/13   Dorthula Matasiffany G Greene, PA-C  Only takes aspirin  Allergies: No Known Allergies  History   Social History  . Marital Status: Single    Spouse Name: Dylan Hoffman    Number of Children: Dylan Hoffman  . Years of Education: Dylan Hoffman   Occupational History  . Works in [employer].    Social History Main Topics  . Smoking status: Current Every Day Smoker -- 0.25 packs/day for 19 years    Types: Cigarettes  . Smokeless tobacco: Never Used  . Alcohol Use: Yes     Comment: occassional  . Drug Use: 1.00 per week    Special: Marijuana  . Sexual Activity: Not Currently   Other Topics Concern  . 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      History reviewed. No pertinent family history.  Review of Systems: General: negative for chills, fever, night sweats or  weight changes.  Cardiovascular: see HPI Dermatological: negative for rash Respiratory: negative for cough or wheezing Urologic: negative for hematuria Abdominal: negative for nausea, vomiting, diarrhea, bright red blood per rectum, melena, or hematemesis Neurologic: negative for visual changes, syncope, or dizziness All other systems reviewed and are otherwise negative except as noted above.  Labs:   Lab Results  Component Value Date   WBC 5.5 03/06/2014   HGB 14.4 03/06/2014   HCT 41.1 03/06/2014   MCV 90.9 03/06/2014   PLT 178 03/06/2014    Recent Labs Lab 03/06/14 2040  NA 141  K 4.2  CL 102  CO2 28  BUN 13  CREATININE 0.84  CALCIUM 9.1   GLUCOSE 98   No results for input(s): CKTOTAL, CKMB, TROPONINI in the last 72 hours. No results found for: CHOL, HDL, LDLCALC, TRIG No results found for: DDIMER  Radiology/Studies:  Dg Chest 2 View  03/06/2014   CLINICAL DATA:  Acute Palpitations and shortness of breath.  EXAM: CHEST  2 VIEW  COMPARISON:  04/02/2013  FINDINGS: The heart size and mediastinal contours are within normal limits. Both lungs are clear. The visualized skeletal structures are unremarkable.  IMPRESSION: Normal chest x-ray.   Electronically Signed   By: Loralie ChampagneMark  Gallerani M.D.   On: 03/06/2014 22:45     EKG: repeat pending  Physical Exam: Blood pressure 95/50, pulse 99, temperature 98 F (36.7 C), temperature source Oral, resp. rate 18, height 6\' 3"  (1.905 m), weight 72.576 kg (160 lb), SpO2 99 %. General: Well developed, well nourished, in no acute distress, multiple tatttoos Head: Normocephalic, atraumatic, sclera non-icteric, nares are without discharge Neck: Supple. Negative for carotid bruits. JVD not elevated. Lungs: Clear bilaterally to auscultation without wheezes, rales, or rhonchi. Breathing is unlabored. Heart: irreg with S1 S2. No murmurs, rubs, or gallops appreciated. Abdomen: Soft, non-tender, non-distended with normoactive bowel sounds. No rebound/guarding. No obvious abdominal masses. Msk:  Strength and tone appear normal for age. Extremities: No edema. No clubbing or cyanosis. Distal pedal pulses are 2+ and equal bilaterally. Neuro: Alert and oriented X 3. Moves all extremities spontaneously. Psych:  Responds to questions appropriately with a normal affect.    ASSESSMENT AND PLAN:  Problem List 1. Atrial fibrillation with RVR, now rate controlled 2. Tobacco abuse 3. Financial and motivational barriers to medical care  33 y.o. male w/ PMHx significant for afib dxd 10 yrs ago, tobacco abuse who [facility] with complaints of palpitations --> found to be in afib  with RVR. Precipitants include potentially caffeine (3-4 drinks/day) and med noncompliance.  Now rate controlled on dilt gtt (interestingly, he is only on 5 of dilt and well controlled which is unexpected given his young age and assumed good conduction system). Hopefully he will convert on his own now that he is rate controlled but if not, will keep NPO for potential procedure (he is > 48 hrs out from conversion). Will restart PO dilt med with overlap of dilt gtt in effort to minimize hospitalization.  Continue aspirin for anticoagulation with CHADS2 score of 0.   Recommend also SW consult in the AM to connect with primary care physician.  Ambulatory.   Signed, Adolm JosephWHITLOCK, Edgel Degnan C. MD 03/06/2014, 11:26 PM  Addendum: UDS positive for cocaine (he endorsed opiates and MJ which are also positive but clearly denied cocaine in my history). Obviously this is more likely as a trigger for his afib. Would avoid beta blockers given this history.

## 2014-03-06 NOTE — ED Notes (Signed)
Pt reports that he has not taken his cardizem or metoprolol in approx 7 months.

## 2014-03-07 ENCOUNTER — Encounter (HOSPITAL_COMMUNITY): Payer: Self-pay | Admitting: General Practice

## 2014-03-07 DIAGNOSIS — I4891 Unspecified atrial fibrillation: Secondary | ICD-10-CM

## 2014-03-07 MED ORDER — ACETAMINOPHEN 325 MG PO TABS: 650.0000 mg | ORAL_TABLET | ORAL | Status: DC | PRN

## 2014-03-07 MED ORDER — DILTIAZEM HCL ER COATED BEADS 120 MG PO CP24: 120.0000 mg | ORAL_CAPSULE | Freq: Every day | ORAL | Status: DC

## 2014-03-07 MED ORDER — DILTIAZEM HCL ER COATED BEADS 120 MG PO CP24
120.0000 mg | ORAL_CAPSULE | Freq: Every day | ORAL | Status: DC
  Administered 2014-03-07: 120 mg via ORAL
  Filled 2014-03-07 (×2): qty 1

## 2014-03-07 MED ORDER — ASPIRIN EC 81 MG PO TBEC
162.0000 mg | DELAYED_RELEASE_TABLET | Freq: Every day | ORAL | Status: DC
  Administered 2014-03-07 – 2014-03-08 (×2): 162 mg via ORAL
  Filled 2014-03-07 (×4): qty 2

## 2014-03-07 NOTE — Progress Notes (Signed)
UR completed 

## 2014-03-07 NOTE — Progress Notes (Signed)
  Echocardiogram 2D Echocardiogram has been performed.  Cathie BeamsGREGORY, Jaquon Gingerich 03/07/2014, 3:51 PM

## 2014-03-07 NOTE — Clinical Social Work Note (Signed)
CSW received referral for substance abuse.  Case discussed with patient, who stated he is not ready to quit yet he would like help stopping cigarette smoking.  Information given to patient about substance abuse facilities in the area and discussed he can ask physician about nicotine patch.  CSW to sign off please re-consult if social work needs arise.  Ervin KnackEric R. Kadien Lineman, MSW, [employer]CSWA [phone]

## 2014-03-07 NOTE — Clinical Social Work Psychosocial (Signed)
Clinical Social Work Department BRIEF PSYCHOSOCIAL ASSESSMENT 03/07/2014  Patient:  Dylan Hoffman,Dylan Hoffman     Account Number:  [id number]     Admit date:  03/06/2014  Clinical Social Worker:  Dylan Hoffman,Dylan Hoffman, LCSWA  Date/Time:  03/07/2014 11:54 AM  Referred by:  Physician  Date Referred:  03/07/2014 Referred for  Substance Abuse   Other Referral:   Interview type:  Patient Other interview type:    PSYCHOSOCIAL DATA Living Status:  FAMILY Admitted from facility:   Level of care:   Primary support name:   Primary support relationship to patient:  FAMILY Degree of support available:   Lives with parent and has siblings    CURRENT CONCERNS Current Concerns  Substance Abuse   Other Concerns:    SOCIAL WORK ASSESSMENT / PLAN Patient is a [age] year old male who lives with his family. Patient has history of substance abuse, patient states he uses marijuana and sometimes cocaine.  Patient states he would like to quit smoking cigarettes, but he is not ready to quit using other substances.  Patient was informed that he needs to be ready to quit in order to quit.  Patient was informed that it may kill him one day.  Patient stated he has not really thought about wanting to quit using, but he may think about it.  Patient was given a list of substance abuse facilities in the area.  Patient states he will look at it and call some time if he feels he is ready to quit.   Assessment/plan status:   Other assessment/ plan:   Information/referral to community resources:    PATIENT'S/FAMILY'S RESPONSE TO PLAN OF CARE: Patient in agreement to plan of care.   Dylan Hoffman, MSW, LCSWA [phone] 03/07/2014 12:01 PM

## 2014-03-07 NOTE — Care Management Note (Signed)
    Page 1 of 1   03/09/2014     4:13:38 PM CARE MANAGEMENT NOTE 03/09/2014  Patient:  Dylan Hoffman,Dylan Hoffman   Account Number:  [id number]  Date Initiated:  03/07/2014  Documentation initiated by:  Alexi Dorminey  Subjective/Objective Assessment:   Pt adm on 03/06/14 with Afib with RVR.  PTA, pt independent of ADLS.  Present history of drug use--pos on admission for cocaine, MJ, benzos.     Action/Plan:   CSW consulted for substance abuse counseling.  Will follow progress.   Anticipated DC Date:  03/08/2014   Anticipated DC Plan:  HOME/SELF CARE  In-house referral  Clinical Social Worker      DC Planning Services  CM consult  Medication Assistance      Choice offered to / List presented to:             Status of service:  Completed, signed off Medicare Important Message given?   (If response is "NO", the following Medicare IM given date fields will be blank) Date Medicare IM given:   Medicare IM given by:   Date Additional Medicare IM given:   Additional Medicare IM given by:    Discharge Disposition:  HOME/SELF CARE  Per UR Regulation:  Reviewed for med. necessity/level of care/duration of stay  If discussed at Long Length of Stay Meetings, dates discussed:    Comments:  03/08/14 Sidney AceJulie Isebella Upshur, RN, BSN [phone] Pt for dc home today; has no PCP or insurance.  He is concerned about affording meds.  Appt made for [facility] at [facility] for 11/6 at 9am.  Pt can use [facility] for filling dc meds.[name].[name].Rx will be $4 for metoprolol and and $10 for Cardizem CD. (30 day supply).  Pt states he can afford this, and will [facility] to [facility] to [facility]s [facility].

## 2014-03-07 NOTE — Progress Notes (Signed)
    Subjective:  Denies SSCP, palpitations or Dyspnea   Objective:  Filed Vitals:   03/07/14 0015 03/07/14 0031 03/07/14 0100 03/07/14 0448  BP: 108/74  109/59 98/62  Pulse: 91  94 90  Temp:  98.2 F (36.8 C) 97.9 F (36.6 C) 97.8 F (36.6 C)  TempSrc:  Oral Oral Oral  Resp: 14  15 15   Height:   6\' 3"  (1.905 m)   Weight:   76.068 kg (167 lb 11.2 oz)   SpO2: 99%  100% 100%    Intake/Output from previous day:  Intake/Output Summary (Last 24 hours) at 03/07/14 [id number] Last data filed at 03/07/14 0500  Gross per 24 hour  Intake     20 ml  Output      0 ml  Net     20 ml    Physical Exam: Affect appropriate Healthy:  appears stated age HEENT: normal Neck supple with no adenopathy JVP normal no bruits no thyromegaly Lungs clear with no wheezing and good diaphragmatic motion Heart:  S1/S2 no murmur, no rub, gallop or click PMI normal Abdomen: benighn, BS positve, no tenderness, no AAA no bruit.  No HSM or HJR Distal pulses intact with no bruits No edema Neuro non-focal Skin warm and dry  Multiple tatoos  No muscular weakness   Lab Results: Basic Metabolic Panel:  Recent Labs  59/56/3809/06/19 2040  NA 141  K 4.2  CL 102  CO2 28  GLUCOSE 98  BUN 13  CREATININE 0.84  CALCIUM 9.1   CBC:  Recent Labs  03/06/14 2040  WBC 5.5  HGB 14.4  HCT 41.1  MCV 90.9  PLT 178    Imaging: Dg Chest 2 View  03/06/2014   CLINICAL DATA:  Acute Palpitations and shortness of breath.  EXAM: CHEST  2 VIEW  COMPARISON:  04/02/2013  FINDINGS: The heart size and mediastinal contours are within normal limits. Both lungs are clear. The visualized skeletal structures are unremarkable.  IMPRESSION: Normal chest x-ray.   Electronically Signed   By: Loralie ChampagneMark  Gallerani M.D.   On: 03/06/2014 22:45    Cardiac Studies:  ECG:  afib nonspecific ST/T wave changes   Telemetry: afib rate 70's   Echo:   Medications:   . aspirin EC  162 mg Oral Daily  . diltiazem  120 mg Oral Daily     .  diltiazem (CARDIZEM) infusion 5 mg/hr (03/07/14 0500)    Assessment/Plan:  PAF:  Related to cocaine and drug use likely  Change to PO cardizem and ASA CHADVASC 0  Echo pending D/C in am with outpatient f/u in [place] Compliance is an issue Discussed drug use  Avoid beta blockers with positive UDS cocaine  Charlton Hawseter Seger Jani 03/07/2014, 8:22 AM

## 2014-03-08 DIAGNOSIS — Z720 Tobacco use: Secondary | ICD-10-CM

## 2014-03-08 DIAGNOSIS — F1910 Other psychoactive substance abuse, uncomplicated: Secondary | ICD-10-CM

## 2014-03-08 DIAGNOSIS — F1410 Cocaine abuse, uncomplicated: Secondary | ICD-10-CM

## 2014-03-08 MED ORDER — DILTIAZEM HCL ER COATED BEADS 240 MG PO CP24: 240.0000 mg | ORAL_CAPSULE | Freq: Every day | ORAL | Status: DC

## 2014-03-08 MED ORDER — ASPIRIN 162 MG PO TBEC
162.0000 mg | DELAYED_RELEASE_TABLET | Freq: Every day | ORAL | Status: AC
Start: 2014-03-08 — End: ?

## 2014-03-08 MED ORDER — LABETALOL HCL 200 MG PO TABS: 200.0000 mg | ORAL_TABLET | Freq: Two times a day (BID) | ORAL | Status: AC

## 2014-03-08 MED ORDER — DILTIAZEM HCL ER COATED BEADS 180 MG PO CP24
180.0000 mg | ORAL_CAPSULE | Freq: Every day | ORAL | Status: DC
  Administered 2014-03-08: 180 mg via ORAL
  Filled 2014-03-08: qty 1

## 2014-03-08 MED ORDER — LABETALOL HCL 200 MG PO TABS
200.0000 mg | ORAL_TABLET | Freq: Two times a day (BID) | ORAL | Status: DC
  Administered 2014-03-08: 200 mg via ORAL
  Filled 2014-03-08 (×3): qty 1

## 2014-03-08 MED ORDER — DILTIAZEM HCL ER COATED BEADS 240 MG PO CP24: 240.0000 mg | ORAL_CAPSULE | Freq: Every day | ORAL | Status: AC

## 2014-03-08 NOTE — Discharge Instructions (Signed)
Atrial Fibrillation  Atrial fibrillation is a condition that causes your heart to beat irregularly. It may also cause your heart to beat faster than normal. Atrial fibrillation can prevent your heart from pumping blood normally. It increases your risk of stroke and heart problems.  HOME CARE  · Take medications as told by your doctor.  · Only take medications that your doctor says are safe. Some medications can make the condition worse or happen again.  · If blood thinners were prescribed by your doctor, take them exactly as told. Too much can cause bleeding. Too little and you will not have the needed protection against stroke and other problems.  · Perform blood tests at home if told by your doctor.  · Perform blood tests exactly as told by your doctor.  · Do not drink alcohol.  · Do not drink beverages with caffeine such as coffee, soda, and some teas.  · Maintain a healthy weight.  · Do not use diet pills unless your doctor says they are safe. They may make heart problems worse.  · Follow diet instructions as told by your doctor.  · Exercise regularly as told by your doctor.  · Keep all follow-up appointments.  GET HELP IF:  · You notice a change in the speed, rhythm, or strength of your heartbeat.  · You suddenly begin peeing (urinating) more often.  · You get tired more easily when moving or exercising.  GET HELP RIGHT AWAY IF:   · You have chest or belly (abdominal) pain.  · You feel sick to your stomach (nauseous).  · You are short of breath.  · You suddenly have swollen feet and ankles.  · You feel dizzy.  · You face, arms, or legs feel numb or weak.  · There is a change in your vision or speech.  MAKE SURE YOU:   · Understand these instructions.  · Will watch your condition.  · Will get help right away if you are not doing well or get worse.  Document Released: 01/29/2008 Document Revised: 09/05/2013 Document Reviewed: 06/01/2012  ExitCare® Patient Information ©2015 [employer]. This information is not  intended to replace advice given to you by your health care provider. Make sure you discuss any questions you have with your health care provider.

## 2014-03-08 NOTE — Discharge Summary (Signed)
Discharge Summary   Patient ID: Dylan Hoffman,  MRN: [id number], DOB/AGE: 33/12/1980 33 y.o.  Admit date: 03/06/2014 Discharge date: 03/08/2014  Primary Care Provider: No PCP Per Patient Primary Cardiologist: Dr. Jens Somrenshaw  Discharge Diagnoses Principal Problem:   Atrial fibrillation with RVR Active Problems:   Tobacco abuse   Cocaine abuse   Polysubstance abuse   Allergies No Known Allergies  Procedures  Echocardiogram 03/07/2014 - Left ventricle: The cavity size was normal. Systolic function was normal. The estimated ejection fraction was in the range of 60% to 65%. Wall motion was normal; there were no regional wall motion abnormalities. The study was not technically sufficient to allow evaluation of LV diastolic dysfunction due to atrial fibrillation. - Aortic valve: Trileaflet; normal thickness leaflets. There was no regurgitation. - Mitral valve: There was trivial regurgitation. - Right ventricle: Systolic function was normal. - Right atrium: The atrium was normal in size. - Tricuspid valve: There was trivial regurgitation. - Pulmonary arteries: The main pulmonary artery was normal-sized. Systolic pressure was within the normal range. - Inferior vena cava: The vessel was normal in size. - Pericardium, extracardiac: There was no pericardial effusion.  Impressions:  - Normal biventricular size and systolic function. Normal left atrial size. Trivial mitral and tricuspid regurgitation.      Hospital Course  The patient is a 33 year old male has past medical history of paroxysmal atrial fibrillation diagnosed 10 years ago, tobacco abuse, polysubstance abuse including cocaine, marijuana and opioid. Patient [facility]  03/06/2014 with palpitation. Of note, he was previously on diltiazem and metoprolol, however has not taken any in the past 7 month due to financial issues. He also endorsed decreased exercise tolerance. On arrival,  EKG showed patient was in atrial fibrillation with heart rate in the 140s. He was started on IV diltiazem. Initial cardiac enzyme was negative. Echocardiogram obtained on 03/07/2014 which showed EF 60-65%, normal left atrial size. He is atrial fibrillation with RVR was thought to be related to drug abuse. We had extensive discussion with the patient regarding the need to stop illicit drug use, stopped smoking, avoid excessive high caffeinated drinks and ETOH.   Patient was seen in the morning of 03/08/2014. Telemetry overnight noted his heart rate was mainly in the 80s to 90s during sleep, however increase up to 120s after waking up. Patient's diltiazem was increased from 120 mg to 240mg  long acting diltiazem. Labetalol 200 mg twice a day was added to his medical regimen as well. Given his cocaine abuse, his metoprolol was changed to labetalol to decrease the chance of uninhibited alpha adrenergic stimulation. Per Dr. Eden EmmsNishan, patient's CHA2DS2-Vasc score was 0, he will be continued on aspirin daily. He will need to follow up with Dr. Jens Somrenshaw in 3 weeks, at which time if he continued to be atrial fibrillation, he will need systemic anticoagulation therapy prior to outpatient DC cardioversion. At this time, patient is deemed stable for discharge from cardiology perspective.  Discharge Vitals Blood pressure 116/68, pulse 62, temperature 98 F (36.7 C), temperature source Oral, resp. rate 18, height 6\' 3"  (1.905 m), weight 167 lb 11.2 oz (76.068 kg), SpO2 100 %.  Filed Weights   03/06/14 2038 03/07/14 0100  Weight: 160 lb (72.576 kg) 167 lb 11.2 oz (76.068 kg)    Labs  CBC  Recent Labs  03/06/14 2040  WBC 5.5  HGB 14.4  HCT 41.1  MCV 90.9  PLT 178   Basic Metabolic Panel  Recent Labs  03/06/14 2040  NA  141  K 4.2  CL 102  CO2 28  GLUCOSE 98  BUN 13  CREATININE 0.84  CALCIUM 9.1    Disposition  Pt is being discharged home today in good condition.  Follow-up Plans &  Appointments      Follow-up Information    [facility] On 03/10/2014.   Why:  9:00am; please bring photo ID and all medications you are currently taking   Contact information:   [address] [place] [id number] [phone]      Follow up with Olga MillersBrian Crenshaw, MD.   Specialty:  Cardiology   Why:  Office will call you to schedule followup, if you do not hear from us in 2 business days, please give us a call to schedule followup   Contact information:   [address] [place] [address] [phone]       Discharge Medications    Medication List    STOP taking these medications        metoprolol tartrate 25 MG tablet  Commonly known as:  LOPRESSOR      TAKE these medications        aspirin 162 MG EC tablet  Take 1 tablet (162 mg total) by mouth daily.     diltiazem 240 MG 24 hr capsule  Commonly known as:  CARDIZEM CD  Take 1 capsule (240 mg total) by mouth daily.  Start taking on:  03/09/2014     labetalol 200 MG tablet  Commonly known as:  NORMODYNE  Take 1 tablet (200 mg total) by mouth 2 (two) times daily.        Outstanding Labs/Studies  Possible DC cardioversion in 3-4 weeks if continue to be in atrial fibrillation on followup  Duration of Discharge Encounter   Greater than 30 minutes including physician time.  Ramond DialSigned, Joson Sapp PA-C Pager: [phone] 03/08/2014, 12:48 PM

## 2014-03-08 NOTE — Progress Notes (Addendum)
Patient Name: Dylan LevinsDevin J Hoffman Date of Encounter: 03/08/2014     Active Problems:   Atrial fibrillation with RVR    SUBJECTIVE  Denies any CP or SOB. Mild palpitation when his HR gets fast  CURRENT MEDS . aspirin EC  162 mg Oral Daily  . diltiazem  120 mg Oral Daily    OBJECTIVE  Filed Vitals:   03/07/14 1020 03/07/14 1400 03/07/14 2133 03/08/14 0441  BP: 144/67 126/65 112/77 107/76  Pulse:  89 90 62  Temp:  98 F (36.7 C) 98 F (36.7 C) 98 F (36.7 C)  TempSrc:  Oral Oral Oral  Resp:  18 18 18   Height:      Weight:      SpO2:  100% 100% 100%    Intake/Output Summary (Last 24 hours) at 03/08/14 0924 Last data filed at 03/07/14 1230  Gross per 24 hour  Intake    360 ml  Output      0 ml  Net    360 ml   Filed Weights   03/06/14 2038 03/07/14 0100  Weight: 160 lb (72.576 kg) 167 lb 11.2 oz (76.068 kg)    PHYSICAL EXAM  General: Pleasant, NAD. Neuro: Alert and oriented X 3. Moves all extremities spontaneously. Psych: Normal affect. HEENT:  Normal  Neck: Supple without bruits or JVD. Lungs:  Resp regular and unlabored, CTA. Heart: tachycardic, irregular. no s3, s4, or murmurs. Abdomen: Soft, non-tender, non-distended, BS + x 4.  Extremities: No clubbing, cyanosis or edema. DP/PT/Radials 2+ and equal bilaterally.  Accessory Clinical Findings  CBC  Recent Labs  03/06/14 2040  WBC 5.5  HGB 14.4  HCT 41.1  MCV 90.9  PLT 178   Basic Metabolic Panel  Recent Labs  03/06/14 2040  NA 141  K 4.2  CL 102  CO2 28  GLUCOSE 98  BUN 13  CREATININE 0.84  CALCIUM 9.1    TELE A-fib with HR 80-low 100s overnight while asleep, HR increase to 120-150s while awake    ECG  A-fib with HR 80s  Echocardiogram  - Left ventricle: The cavity size was normal. Systolic function was normal. The estimated ejection fraction was in the range of 60% to 65%. Wall motion was normal; there were no regional wall motion abnormalities. The study was not  technically sufficient to allow evaluation of LV diastolic dysfunction due to atrial fibrillation. - Aortic valve: Trileaflet; normal thickness leaflets. There was no regurgitation. - Mitral valve: There was trivial regurgitation. - Right ventricle: Systolic function was normal. - Right atrium: The atrium was normal in size. - Tricuspid valve: There was trivial regurgitation. - Pulmonary arteries: The main pulmonary artery was normal-sized. Systolic pressure was within the normal range. - Inferior vena cava: The vessel was normal in size. - Pericardium, extracardiac: There was no pericardial effusion.  Impressions:  - Normal biventricular size and systolic function. Normal left atrial size. Trivial mitral and tricuspid regurgitation.    Radiology/Studies  Dg Chest 2 View  03/06/2014   CLINICAL DATA:  Acute Palpitations and shortness of breath.  EXAM: CHEST  2 VIEW  COMPARISON:  04/02/2013  FINDINGS: The heart size and mediastinal contours are within normal limits. Both lungs are clear. The visualized skeletal structures are unremarkable.  IMPRESSION: Normal chest x-ray.   Electronically Signed   By: Loralie ChampagneMark  Gallerani M.D.   On: 03/06/2014 22:45    ASSESSMENT AND PLAN  1. A-fib w/ RVR  - initially diagnosed 10 yrs ago, unable to  take diltiazem and metoprolol for past 7 mo due to finance  - likely 2/2 cocaine use, avoid BB with positive cocaine in UDS  - Echo 03/07/2014 EF 60-65%   Discussed issues with patient.  D/C today  Use labatolol 200 bid and cardizem 240 mg   Labatolol  Has alpha blocking so risk of unopposed stimulation with cocaine minimal.   ASA   F/u Dr Jens Somrenshaw 3 weeks  If still in afib start anticoagulation and plan [facility]     2. Tobacco abuse 3. Cocaine abuse  Charlton HawsPeter Emiya Loomer

## 2014-03-09 ENCOUNTER — Telehealth: Payer: Self-pay | Admitting: Cardiology

## 2014-03-10 ENCOUNTER — Encounter: Payer: Self-pay | Admitting: Family Medicine

## 2014-03-10 ENCOUNTER — Ambulatory Visit: Payer: Self-pay | Attending: Family Medicine | Admitting: Family Medicine

## 2014-03-10 VITALS — BP 117/76 | HR 124 | Temp 98.1°F | Resp 16 | Ht 75.0 in | Wt 161.0 lb

## 2014-03-10 DIAGNOSIS — F12920 Cannabis use, unspecified with intoxication, uncomplicated: Secondary | ICD-10-CM | POA: Insufficient documentation

## 2014-03-10 DIAGNOSIS — F419 Anxiety disorder, unspecified: Secondary | ICD-10-CM | POA: Insufficient documentation

## 2014-03-10 DIAGNOSIS — Z720 Tobacco use: Secondary | ICD-10-CM

## 2014-03-10 DIAGNOSIS — I4891 Unspecified atrial fibrillation: Secondary | ICD-10-CM | POA: Insufficient documentation

## 2014-03-10 DIAGNOSIS — F1210 Cannabis abuse, uncomplicated: Secondary | ICD-10-CM

## 2014-03-10 DIAGNOSIS — F1290 Cannabis use, unspecified, uncomplicated: Secondary | ICD-10-CM | POA: Insufficient documentation

## 2014-03-10 DIAGNOSIS — F17210 Nicotine dependence, cigarettes, uncomplicated: Secondary | ICD-10-CM | POA: Insufficient documentation

## 2014-03-10 DIAGNOSIS — F1410 Cocaine abuse, uncomplicated: Secondary | ICD-10-CM | POA: Insufficient documentation

## 2014-03-10 MED ORDER — BUPROPION HCL ER (SR) 150 MG PO TB12: 150.0000 mg | ORAL_TABLET | Freq: Two times a day (BID) | ORAL | Status: AC

## 2014-03-10 NOTE — Patient Instructions (Signed)
Mr. Dylan Hoffman,  Thank you for coming in today. It was a pleasure meeting you. I look forward to being your primary doctor.  1. A fib: take diltz, labetalol, asa. Avoid caffeine, cocaine, alcohol.  2. Smoking cessation support: smoking cessation hotline: 1-800-QUIT-NOW.  Smoking cessation classes are available through [facility]. Call [phone] or visit our website at [email or web address]. Start wellbutrin 150 mg daily for 3 days then twice daily. Set quit date for 7 days after starting the medication.  Nicotine replacement options: gum, lozenge, patch 14 mg x 2 weeks, then 7 mg x 4 weeks.   F/u in 6 weeks for smoking and A fib f/u.   Dr. Armen PickupFunches

## 2014-03-10 NOTE — Progress Notes (Signed)
HFU AFib Pt stated feeling fine, had chest pain last night. Heart beat to fast and out of breath Medicine refills

## 2014-03-10 NOTE — Assessment & Plan Note (Signed)
A: reportedly in remission patient understands that continued use will trigger Afib P: F/u q visit

## 2014-03-10 NOTE — Assessment & Plan Note (Signed)
A: in A fib with RVR midly symptomatic.  Meds: took dilt this Am after on meds since d/c from hospital. P: Continue diltz, labetalol and ASA daily Smoking cessation Avoid ETOH, cocaine, caffeine F/u with Dr. Jens Somrenshaw next month

## 2014-03-10 NOTE — Progress Notes (Signed)
   Subjective:    Patient ID: Dylan LevinsDevin J Hoffman, male    DOB: 12/04/1980, 33 y.o.   MRN: [id number] CC: HFU for A fib with RVR  HPI 33 yo M presents to establish care and discuss the following:   #1. A. Fib with RVR: Patient was hospitalized 03/06/2013 to 03/08/2014 with A fib with RVR. He was found to be cocaine, THC and opiate positive. He denies using cocaine and opiods since hospital discharge. He smokes THC daily. He admits to palpitations, intermittent SOB and fatigue that is improved with lying down. No syncope, CP or lightheadedness. He is taking ASA. Picked up and took diltz this AM. Has not had BB since hospital d/c, but is picking it up today at 3 PM.   2. Smoking: 1/3-1/4 PPD x 20 years. No cough. Desires to quit. Mom smokes at home. Denies depression and SI.   Med Hx: anxiety  Fam Hx: mom is a smoker and has glaucoma  Soc Hx: current smoker  Review of Systems As per HPI     Objective:   Physical Exam BP 117/76 mmHg  Pulse 124  Temp(Src) 98.1 F (36.7 C) (Oral)  Resp 16  Ht 6\' 3"  (1.905 m)  Wt 161 lb (73.029 kg)  BMI 20.12 kg/m2  SpO2 100% General appearance: alert, cooperative and no distress Neck: supple, symmetrical, trachea midline and thyroid not enlarged, symmetric, no tenderness/mass/nodules Lungs: clear to auscultation bilaterally Heart: irregularly irregular rhythm Extremities: extremities normal, atraumatic, no cyanosis or edema       Assessment & Plan:

## 2014-03-10 NOTE — Assessment & Plan Note (Signed)
A: smoker since age 33, 1/3 PPD. Ready to quit P: Nicotine replacement  wellbutrin  F/u in 6 weeks

## 2014-03-13 NOTE — Telephone Encounter (Signed)
Closed encounter °

## 2014-04-02 ENCOUNTER — Encounter (HOSPITAL_COMMUNITY): Payer: Self-pay | Admitting: *Deleted

## 2014-04-02 ENCOUNTER — Emergency Department (HOSPITAL_COMMUNITY)
Admission: EM | Admit: 2014-04-02 | Discharge: 2014-04-02 | Disposition: A | Discharge status: Home / Self Care | Payer: MEDICAID | Attending: Emergency Medicine | Admitting: Emergency Medicine

## 2014-04-02 DIAGNOSIS — F419 Anxiety disorder, unspecified: Secondary | ICD-10-CM | POA: Insufficient documentation

## 2014-04-02 DIAGNOSIS — Z8619 Personal history of other infectious and parasitic diseases: Secondary | ICD-10-CM | POA: Insufficient documentation

## 2014-04-02 DIAGNOSIS — K219 Gastro-esophageal reflux disease without esophagitis: Secondary | ICD-10-CM | POA: Insufficient documentation

## 2014-04-02 DIAGNOSIS — F909 Attention-deficit hyperactivity disorder, unspecified type: Secondary | ICD-10-CM | POA: Insufficient documentation

## 2014-04-02 DIAGNOSIS — I4891 Unspecified atrial fibrillation: Secondary | ICD-10-CM | POA: Insufficient documentation

## 2014-04-02 DIAGNOSIS — Z87442 Personal history of urinary calculi: Secondary | ICD-10-CM | POA: Insufficient documentation

## 2014-04-02 DIAGNOSIS — Z79899 Other long term (current) drug therapy: Secondary | ICD-10-CM | POA: Insufficient documentation

## 2014-04-02 DIAGNOSIS — Z8709 Personal history of other diseases of the respiratory system: Secondary | ICD-10-CM | POA: Insufficient documentation

## 2014-04-02 DIAGNOSIS — G510 Bell's palsy: Secondary | ICD-10-CM | POA: Insufficient documentation

## 2014-04-02 DIAGNOSIS — Z7982 Long term (current) use of aspirin: Secondary | ICD-10-CM | POA: Insufficient documentation

## 2014-04-02 DIAGNOSIS — Z720 Tobacco use: Secondary | ICD-10-CM | POA: Insufficient documentation

## 2014-04-02 MED ORDER — PREDNISONE 10 MG PO TABS: 60.0000 mg | ORAL_TABLET | Freq: Every day | ORAL | Status: AC

## 2014-04-02 MED ORDER — ACYCLOVIR 400 MG PO TABS: 400.0000 mg | ORAL_TABLET | Freq: Four times a day (QID) | ORAL | Status: AC

## 2014-04-02 MED ORDER — PREDNISONE 20 MG PO TABS
60.0000 mg | ORAL_TABLET | Freq: Once | ORAL | Status: AC
  Administered 2014-04-02: 60 mg via ORAL
  Filled 2014-04-02: qty 3

## 2014-04-02 MED ORDER — HYPROMELLOSE (GONIOSCOPIC) 2.5 % OP SOLN: 2.0000 [drp] | Freq: Four times a day (QID) | OPHTHALMIC | Status: AC | PRN

## 2014-04-02 NOTE — Discharge Instructions (Signed)
Bell's Palsy °Bell's palsy is a condition in which the muscles on one side of the face cannot move (paralysis). This is because the nerves in the face are paralyzed. It is most often thought to be caused by a virus. The virus causes swelling of the nerve that controls movement on one side of the face. The nerve travels through a tight space surrounded by bone. When the nerve swells, it can be compressed by the bone. This results in damage to the protective covering around the nerve. This damage interferes with how the nerve communicates with the muscles of the face. As a result, it can cause weakness or paralysis of the facial muscles.  °Injury (trauma), tumor, and surgery may cause Bell's palsy, but most of the time the cause is unknown. It is a relatively common condition. It starts suddenly (abrupt onset) with the paralysis usually ending within 2 days. Bell's palsy is not dangerous. But because the eye does not close properly, you may need care to keep the eye from getting dry. This can include splinting (to keep the eye shut) or moistening with artificial tears. Bell's palsy very seldom occurs on both sides of the face at the same time. °SYMPTOMS  °· Eyebrow sagging. °· Drooping of the eyelid and corner of the mouth. °· Inability to close one eye. °· Loss of taste on the front of the tongue. °· Sensitivity to loud noises. °TREATMENT  °The treatment is usually non-surgical. If the patient is seen within the first 24 to 48 hours, a short course of steroids may be prescribed, in an attempt to shorten the length of the condition. Antiviral medicines may also be used with the steroids, but it is unclear if they are helpful.  °You will need to protect your eye, if you cannot close it. The cornea (clear covering over your eye) will become dry and can be damaged. Artificial tears can be used to keep your eye moist. Glasses or an eye patch should be worn to protect your eye. °PROGNOSIS  °Recovery is variable, ranging  from days to months. Although the problem usually goes away completely (about 80% of cases resolve), predicting the outcome is impossible. Most people improve within 3 weeks of when the symptoms began. Improvement may continue for 3 to 6 months. A small number of people have moderate to severe weakness that is permanent.  °HOME CARE INSTRUCTIONS  °· If your caregiver prescribed medication to reduce swelling in the nerve, use as directed. Do not stop taking the medication unless directed by your caregiver. °· Use moisturizing eye drops as needed to prevent drying of your eye, as directed by your caregiver. °· Protect your eye, as directed by your caregiver. °· Use facial massage and exercises, as directed by your caregiver. °· Perform your normal activities, and get your normal rest. °SEEK IMMEDIATE MEDICAL CARE IF:  °· There is pain, redness or irritation in the eye. °· You or your child has an oral temperature above 102° F (38.9° C), not controlled by medicine. °MAKE SURE YOU:  °· Understand these instructions. °· Will watch your condition. °· Will get help right away if you are not doing well or get worse. °Document Released: 04/21/2005 Document Revised: 07/14/2011 Document Reviewed: 07/29/2013 °ExitCare® Patient Information ©2015 [employer]. This information is not intended to replace advice given to you by your health care provider. Make sure you discuss any questions you have with your health care provider. ° °

## 2014-04-02 NOTE — ED Provider Notes (Signed)
CSN: [id number]     Arrival date & time 04/02/14  1252 History   First MD Initiated Contact with Patient 04/02/14 1338     Chief Complaint  Patient presents with  . Facial Droop      HPI Patient presents with left-sided facial weakness over the past 7-10 days.  States is progressively worsened.  Denies weakness of his arms or legs.  No recent illness.  No difficulty breathing or swallowing.  Reports tearing of his left eye.  Symptoms are mild to moderate in severity  Past Medical History  Diagnosis Date  . Anxiety   . Gonorrhea   . Afib   . Spontaneous pneumothorax ~ 2006  . Daily headache   . Kidney stone ~ 2011    "passed it" (05/12/2013)  . ADHD (attention deficit hyperactivity disorder)     "dx'd as a teen; haven't taked RX in 15 years" (05/12/2013)  . GERD (gastroesophageal reflux disease)    Past Surgical History  Procedure Laterality Date  . Percutaneous pinning phalanx fracture of hand Right ~ 2010   Family History  Problem Relation Age of Onset  . Heart disease Paternal Grandfather   . Glaucoma Mother    History  Substance Use Topics  . Smoking status: Current Every Day Smoker -- 0.25 packs/day for 19 years    Types: Cigarettes  . Smokeless tobacco: Never Used  . Alcohol Use: Yes     Comment: occassional    Review of Systems  All other systems reviewed and are negative.     Allergies  Review of patient's allergies indicates no known allergies.  Home Medications   Prior to Admission medications   Medication Sig Start Date End Date Taking? Authorizing Provider  acyclovir (ZOVIRAX) 400 MG tablet Take 1 tablet (400 mg total) by mouth 4 (four) times daily. 04/02/14   Lyanne CoKevin M Libi Corso, MD  aspirin EC 162 MG EC tablet Take 1 tablet (162 mg total) by mouth daily. 03/08/14   Azalee CourseHao Meng, PA  buPROPion (WELLBUTRIN SR) 150 MG 12 hr tablet Take 1 tablet (150 mg total) by mouth 2 (two) times daily. Take once daily for 3 days, then twice daily, 8 hrs apart, last dose  before 6 pm 03/10/14   Josalyn C Funches, MD  diltiazem (CARDIZEM CD) 240 MG 24 hr capsule Take 1 capsule (240 mg total) by mouth daily. 03/09/14   Azalee CourseHao Meng, PA  hydroxypropyl methylcellulose / hypromellose (ISOPTO TEARS / GONIOVISC) 2.5 % ophthalmic solution Place 2 drops into the left eye 4 (four) times daily as needed for dry eyes. 04/02/14   Lyanne CoKevin M Namir Neto, MD  labetalol (NORMODYNE) 200 MG tablet Take 1 tablet (200 mg total) by mouth 2 (two) times daily. 03/08/14   Azalee CourseHao Meng, PA  predniSONE (DELTASONE) 10 MG tablet Take 6 tablets (60 mg total) by mouth daily. 04/02/14   Lyanne CoKevin M Dacey Milberger, MD   BP 112/74 mmHg  Pulse 74  Temp(Src) 97.9 F (36.6 C) (Oral)  Resp 16  SpO2 100% Physical Exam  Constitutional: He is oriented to person, place, and time. He appears well-developed and well-nourished.  HENT:  Head: Atraumatic.  Left-sided facial weakness.  Tearing of left eye.  Weakness of the left forehead.   Eyes: EOM are normal.  Neck: Normal range of motion.  Cardiovascular: Normal rate, regular rhythm, normal heart sounds and intact distal pulses.   Pulmonary/Chest: Effort normal and breath sounds normal. No respiratory distress.  Abdominal: Soft. He exhibits no distension. There is  no tenderness.  Musculoskeletal: Normal range of motion.  Neurological: He is alert and oriented to person, place, and time.  Normal strength in arms and legs.  Left-sided facial weakness.  Tearing of left eye.  Weakness of the left forehead.  Skin: Skin is warm and dry.  Psychiatric: He has a normal mood and affect. Judgment normal.  Nursing note and vitals reviewed.   ED Course  Procedures (including critical care time) Labs Review Labs Reviewed - No data to display  Imaging Review No results found.   EKG Interpretation None      MDM   Final diagnoses:  Bell's palsy    Bell's palsy on the left.  Patient be placed on steroids and antivirals.  Recommended artificial tears for left eye.    Lyanne CoKevin  M Zymir Napoli, MD 04/02/14 [id number]

## 2014-04-02 NOTE — ED Notes (Signed)
Pt reports left facial palsy x 10 days. States palsy has gotten progressively worse. Denies any other symptoms with droop. Denies difficulty breathing or swallowing. Recently discharged from hospital and prescribed Wellbutrin and Labetalol and thinks he may be having a reaction from it. Denies pain with facial droop. Pt neuro intact aside from droop.

## 2014-04-02 NOTE — ED Notes (Signed)
Patient to ED with left facial droop. Onset 1 week ago. Patient states that it got worse which prompted his visit to the ED today

## 2014-04-05 ENCOUNTER — Encounter: Payer: Self-pay | Admitting: *Deleted

## 2014-04-06 NOTE — Progress Notes (Signed)
HPI: FU atrial fibrillation. Echocardiogram November 2015 showed normal LV function and trace mitral regurgitation. Patient has had paroxysmal atrial fibrillation particularly in the setting of substance abuse. Medications were resumed with the hope of atrial fibrillation converted to sinus rhythm. Since discharge,   Current Outpatient Prescriptions  Medication Sig Dispense Refill  . acyclovir (ZOVIRAX) 400 MG tablet Take 1 tablet (400 mg total) by mouth 4 (four) times daily. 28 tablet 0  . aspirin EC 162 MG EC tablet Take 1 tablet (162 mg total) by mouth daily.    [name]. buPROPion (WELLBUTRIN SR) 150 MG 12 hr tablet Take 1 tablet (150 mg total) by mouth 2 (two) times daily. Take once daily for 3 days, then twice daily, 8 hrs apart, last dose before 6 pm 60 tablet 1  . diltiazem (CARDIZEM CD) 240 MG 24 hr capsule Take 1 capsule (240 mg total) by mouth daily. 30 capsule 5  . hydroxypropyl methylcellulose / hypromellose (ISOPTO TEARS / GONIOVISC) 2.5 % ophthalmic solution Place 2 drops into the left eye 4 (four) times daily as needed for dry eyes. 15 mL 12  . labetalol (NORMODYNE) 200 MG tablet Take 1 tablet (200 mg total) by mouth 2 (two) times daily. 60 tablet 5  . predniSONE (DELTASONE) 10 MG tablet Take 6 tablets (60 mg total) by mouth daily. 30 tablet 0  . [DISCONTINUED] diltiazem (CARDIZEM) 60 MG tablet Take 1 tablet (60 mg total) by mouth every 12 (twelve) hours. 60 tablet 1   No current facility-administered medications for this visit.     Past Medical History  Diagnosis Date  . Anxiety   . Gonorrhea   . Afib   . Spontaneous pneumothorax ~ 2006  . Daily headache   . Kidney stone ~ 2011    "passed it" (05/12/2013)  . ADHD (attention deficit hyperactivity disorder)     "dx'd as a teen; haven't taked RX in 15 years" (05/12/2013)  . GERD (gastroesophageal reflux disease)     Past Surgical History  Procedure Laterality Date  . Percutaneous pinning phalanx fracture of hand Right ~  2010    History   Social History  . Marital Status: Single    Spouse Name: N/A    Number of Children: N/A  . Years of Education: N/A   Occupational History  . 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    Social History Main Topics  . Smoking status: Current Every Day Smoker -- 0.25 packs/day for 19 years    Types: Cigarettes  . Smokeless tobacco: Never Used  . Alcohol Use: Yes     Comment: occassional  . Drug Use: 1.00 per week    Special: Marijuana  . Sexual Activity: Not Currently   Other Topics Concern  . 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    Lives at home with his parents.   Mom smokes.        ROS: no fevers or chills, productive cough, hemoptysis, dysphasia, odynophagia, melena, hematochezia, dysuria, hematuria, rash, seizure activity, orthopnea, PND, pedal edema, claudication. Remaining systems are negative.  Physical Exam: Well-developed well-nourished in no acute distress.  Skin is warm and dry.  HEENT is normal.  Neck is supple.  Chest is clear to auscultation with normal expansion.  Cardiovascular exam is regular rate and rhythm.  Abdominal exam nontender or distended. No masses palpated. Extremities show no edema. neuro grossly intact  ECG  This encounter was created in error - please disregard.

## 2014-04-07 ENCOUNTER — Encounter: Payer: Self-pay | Admitting: Cardiology

## 2014-06-06 NOTE — Progress Notes (Signed)
HPI: follow-up atrial fibrillation. Last echocardiogram November 2015 showed normal LV function, trace mitral and tricuspid regurgitation. Patient with history of paroxysmal atrial fibrillation and noncompliance with medication. Also history of drug use. Last admitted with atrial fibrillation in November 2015. Drug screen positive for cocaine. Since last seen  Current Outpatient Prescriptions  Medication Sig Dispense Refill  . acyclovir (ZOVIRAX) 400 MG tablet Take 1 tablet (400 mg total) by mouth 4 (four) times daily. 28 tablet 0  . aspirin EC 162 MG EC tablet Take 1 tablet (162 mg total) by mouth daily.    [name] buPROPion (WELLBUTRIN SR) 150 MG 12 hr tablet Take 1 tablet (150 mg total) by mouth 2 (two) times daily. Take once daily for 3 days, then twice daily, 8 hrs apart, last dose before 6 pm 60 tablet 1  . diltiazem (CARDIZEM CD) 240 MG 24 hr capsule Take 1 capsule (240 mg total) by mouth daily. 30 capsule 5  . hydroxypropyl methylcellulose / hypromellose (ISOPTO TEARS / GONIOVISC) 2.5 % ophthalmic solution Place 2 drops into the left eye 4 (four) times daily as needed for dry eyes. 15 mL 12  . labetalol (NORMODYNE) 200 MG tablet Take 1 tablet (200 mg total) by mouth 2 (two) times daily. 60 tablet 5  . predniSONE (DELTASONE) 10 MG tablet Take 6 tablets (60 mg total) by mouth daily. 30 tablet 0  . [DISCONTINUED] diltiazem (CARDIZEM) 60 MG tablet Take 1 tablet (60 mg total) by mouth every 12 (twelve) hours. 60 tablet 1   No current facility-administered medications for this visit.     Past Medical History  Diagnosis Date  . Anxiety   . Gonorrhea   . Afib   . Spontaneous pneumothorax ~ 2006  . Daily headache   . Kidney stone ~ 2011    "passed it" (05/12/2013)  . ADHD (attention deficit hyperactivity disorder)     "dx'd as a teen; haven't taked RX in 15 years" (05/12/2013)  . GERD (gastroesophageal reflux disease)     Past Surgical History  Procedure Laterality Date  .  Percutaneous pinning phalanx fracture of hand Right ~ 2010    History   Social History  . Marital Status: Single    Spouse Name: N/A    Number of Children: N/A  . Years of Education: N/A   Occupational History  . 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    Social History Main Topics  . Smoking status: Current Every Day Smoker -- 0.25 packs/day for 19 years    Types: Cigarettes  . Smokeless tobacco: Never Used  . Alcohol Use: Yes     Comment: occassional  . Drug Use: 1.00 per week    Special: Marijuana  . Sexual Activity: Not Currently   Other Topics Concern  . Not on file   Social History Narrative   Previously a Theme park manager, now a Engineer, mining for [employer].    Lives at home with his parents.   Mom smokes.        ROS: no fevers or chills, productive cough, hemoptysis, dysphasia, odynophagia, melena, hematochezia, dysuria, hematuria, rash, seizure activity, orthopnea, PND, pedal edema, claudication. Remaining systems are negative.  Physical Exam: Well-developed well-nourished in no acute distress.  Skin is warm and dry.  HEENT is normal.  Neck is supple.  Chest is clear to auscultation with normal expansion.  Cardiovascular exam is regular rate and rhythm.  Abdominal exam nontender or distended. No masses palpated. Extremities show no edema.  neuro grossly intact  ECG    [ This encounter was created in error - please disregard.

## 2014-06-08 ENCOUNTER — Encounter: Payer: Self-pay | Admitting: Cardiology

## 2014-06-13 ENCOUNTER — Encounter: Payer: Self-pay | Admitting: Cardiology

## 2015-05-05 IMAGING — CR DG CHEST 2V
2 series · 2 of 2 positions shown · non-contrast
Comparison: 04/02/2013

CLINICAL DATA: Acute Palpitations and shortness of breath.

EXAM:
CHEST  2 VIEW

[w chest pa]
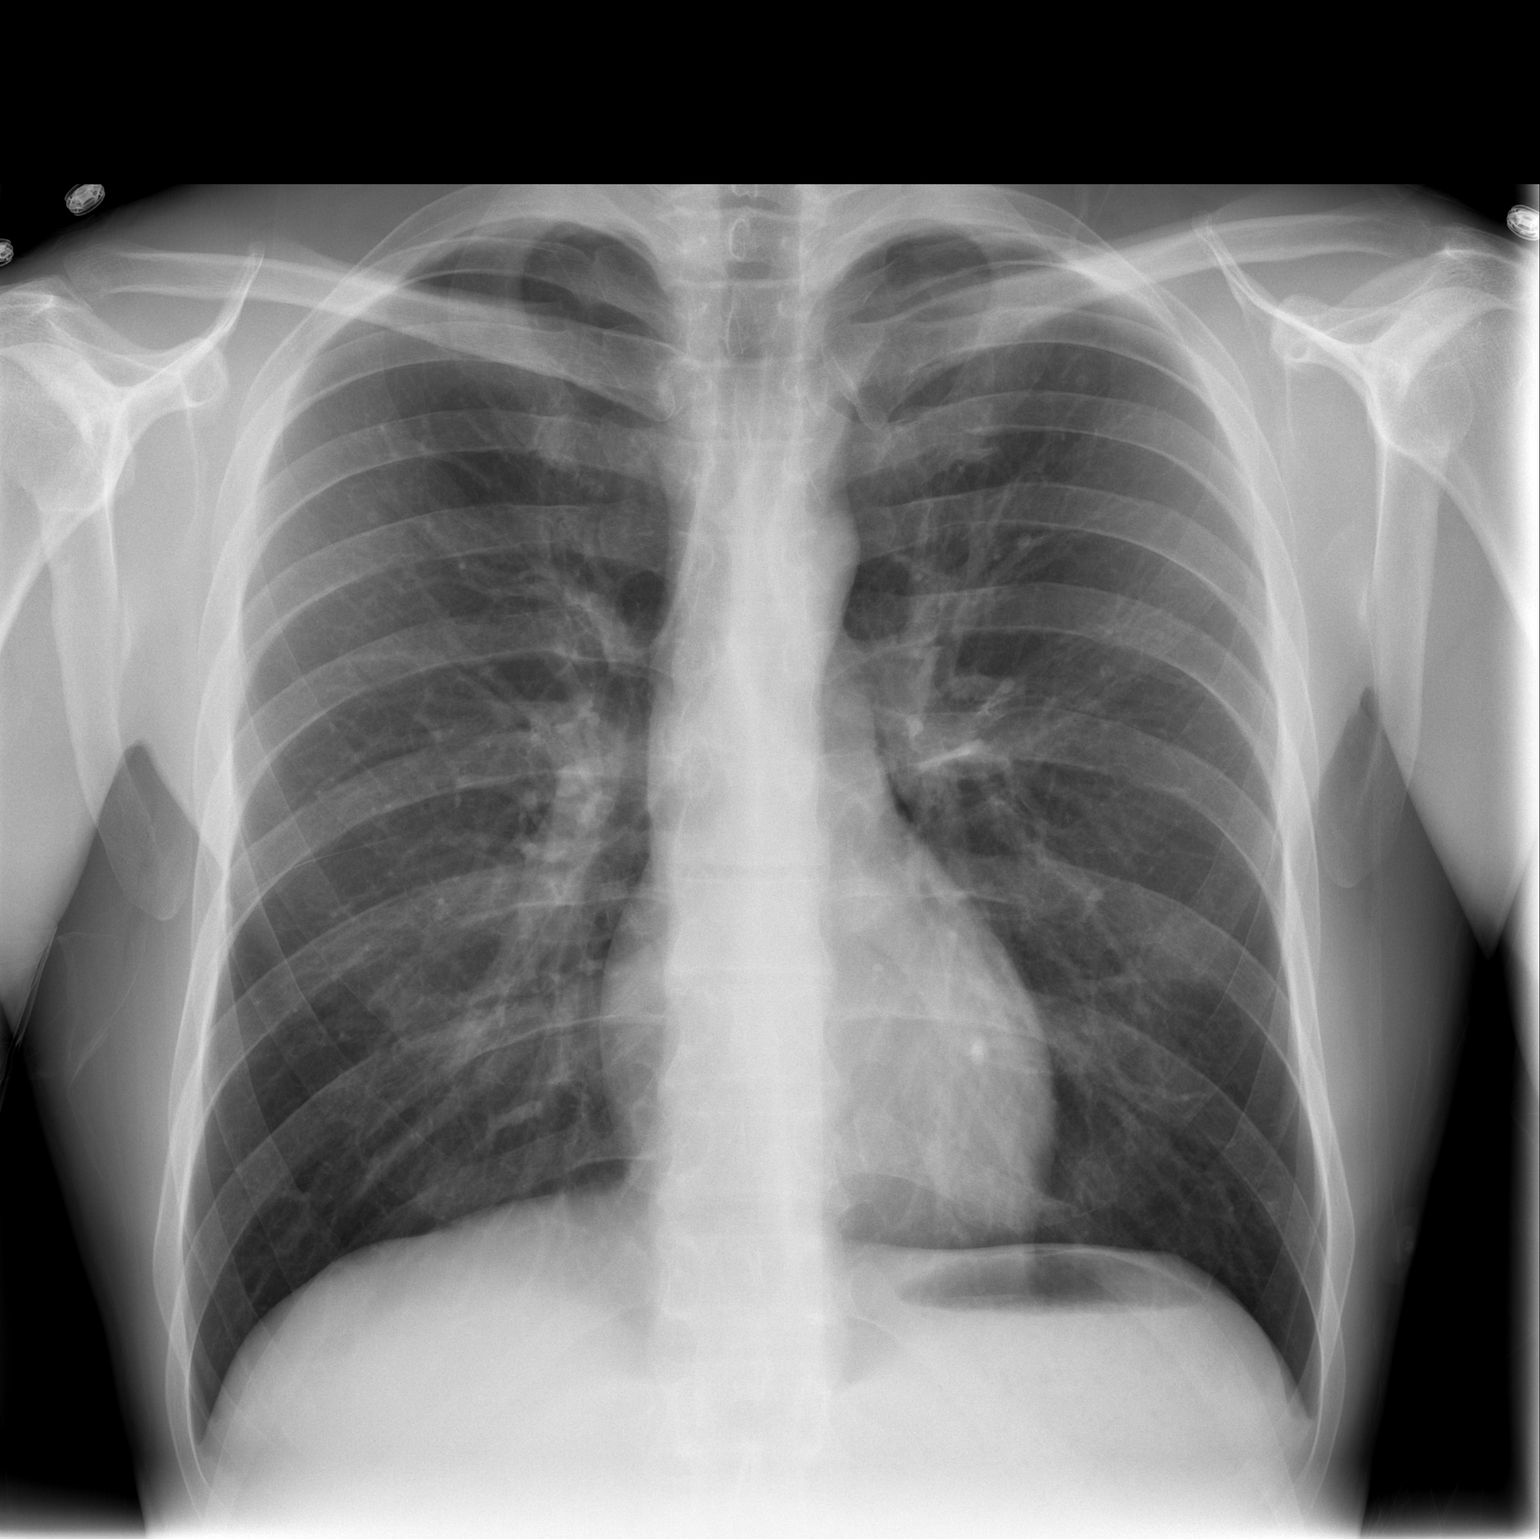

[w chest lat]
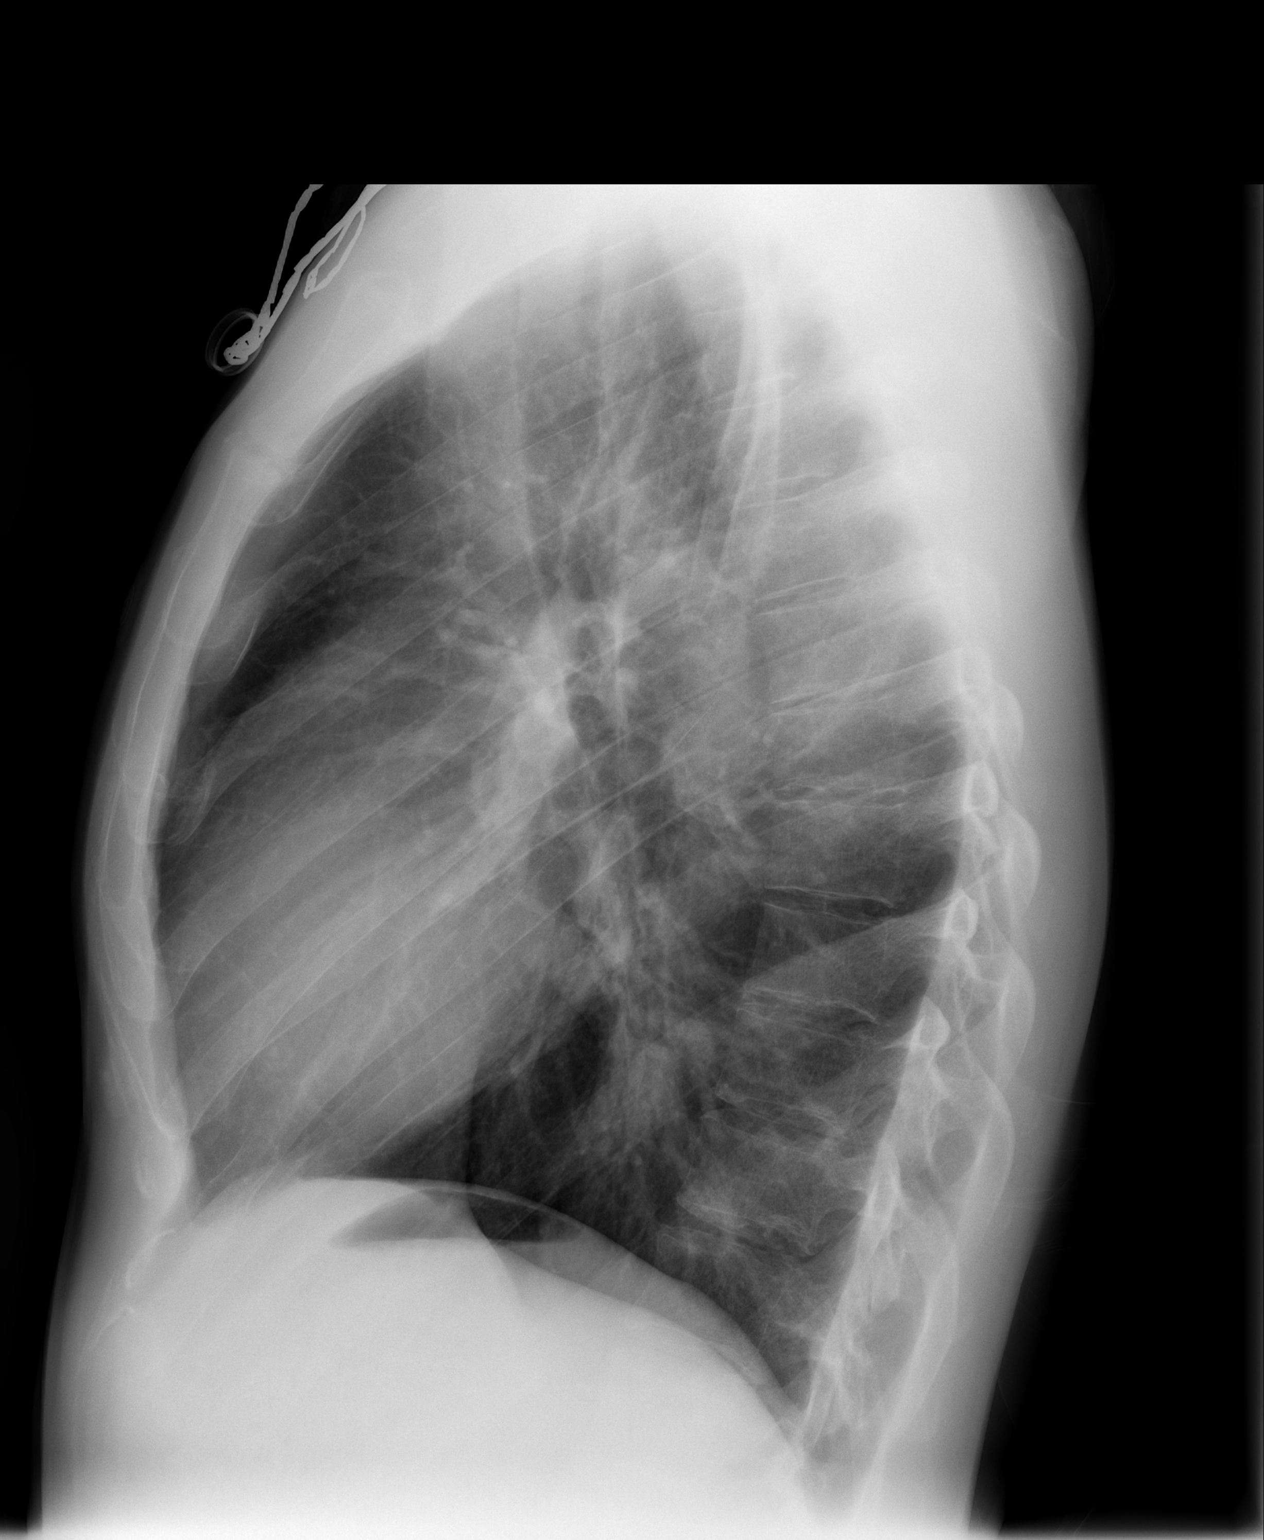

[2 of 2 positions shown; findings below may reference images not displayed]

FINDINGS: The heart size and mediastinal contours are within normal limits.
Both lungs are clear. The visualized skeletal structures are
unremarkable.
IMPRESSION: Normal chest x-ray.

## 2016-10-12 ENCOUNTER — Encounter (HOSPITAL_COMMUNITY): Payer: Self-pay | Admitting: Emergency Medicine

## 2016-10-12 ENCOUNTER — Emergency Department (HOSPITAL_COMMUNITY)
Admission: EM | Admit: 2016-10-12 | Discharge: 2016-10-13 | Disposition: A | Discharge status: Home / Self Care | Payer: Self-pay | Attending: Emergency Medicine | Admitting: Emergency Medicine

## 2016-10-12 DIAGNOSIS — F17210 Nicotine dependence, cigarettes, uncomplicated: Secondary | ICD-10-CM | POA: Insufficient documentation

## 2016-10-12 DIAGNOSIS — F909 Attention-deficit hyperactivity disorder, unspecified type: Secondary | ICD-10-CM | POA: Insufficient documentation

## 2016-10-12 DIAGNOSIS — T40601A Poisoning by unspecified narcotics, accidental (unintentional), initial encounter: Secondary | ICD-10-CM

## 2016-10-12 DIAGNOSIS — Z79899 Other long term (current) drug therapy: Secondary | ICD-10-CM | POA: Insufficient documentation

## 2016-10-12 DIAGNOSIS — Z7982 Long term (current) use of aspirin: Secondary | ICD-10-CM | POA: Insufficient documentation

## 2016-10-12 DIAGNOSIS — T402X1A Poisoning by other opioids, accidental (unintentional), initial encounter: Secondary | ICD-10-CM | POA: Insufficient documentation

## 2016-10-12 LAB — COMPREHENSIVE METABOLIC PANEL
ALT: 21 U/L (ref 17–63)
ANION GAP: 9 (ref 5–15)
AST: 22 U/L (ref 15–41)
Albumin: 4.3 g/dL (ref 3.5–5.0)
Alkaline Phosphatase: 50 U/L (ref 38–126)
BUN: 12 mg/dL (ref 6–20)
CO2: 28 mmol/L (ref 22–32)
CREATININE: 0.86 mg/dL (ref 0.61–1.24)
Calcium: 9.1 mg/dL (ref 8.9–10.3)
Chloride: 105 mmol/L (ref 101–111)
GFR calc non Af Amer: >60 mL/min (ref >60)
Glucose, Bld: 128 mg/dL — ABNORMAL HIGH (ref 65–99)
Potassium: 3.8 mmol/L (ref 3.5–5.1)
SODIUM: 142 mmol/L (ref 135–145)
Total Bilirubin: 0.6 mg/dL (ref 0.3–1.2)
Total Protein: 7.1 g/dL (ref 6.5–8.1)

## 2016-10-12 LAB — CBC WITH DIFFERENTIAL/PLATELET
BASOS ABS: 0 10*3/uL (ref 0.0–0.1)
BASOS PCT: 0 %
EOS ABS: 0 10*3/uL (ref 0.0–0.7)
Eosinophils Relative: 0 %
HCT: 41.5 % (ref 39.0–52.0)
Hemoglobin: 13.9 g/dL (ref 13.0–17.0)
Lymphocytes Relative: 9 %
Lymphs Abs: 1 10*3/uL (ref 0.7–4.0)
MCH: 29.9 pg (ref 26.0–34.0)
MCHC: 33.5 g/dL (ref 30.0–36.0)
MCV: 89.2 fL (ref 78.0–100.0)
MONO ABS: 0.4 10*3/uL (ref 0.1–1.0)
MONOS PCT: 4 %
Neutro Abs: 10.3 10*3/uL — ABNORMAL HIGH (ref 1.7–7.7)
Neutrophils Relative %: 87 %
Platelets: 186 10*3/uL (ref 150–400)
RBC: 4.65 MIL/uL (ref 4.22–5.81)
RDW: 13.2 % (ref 11.5–15.5)
WBC: 11.7 10*3/uL — ABNORMAL HIGH (ref 4.0–10.5)

## 2016-10-12 LAB — CBG MONITORING, ED: Glucose-Capillary: 118 mg/dL — ABNORMAL HIGH (ref 65–99)

## 2016-10-12 LAB — SALICYLATE LEVEL

## 2016-10-12 LAB — ACETAMINOPHEN LEVEL

## 2016-10-12 LAB — ETHANOL

## 2016-10-12 MED ORDER — ONDANSETRON HCL 4 MG/2ML IJ SOLN
4.0000 mg | Freq: Once | INTRAMUSCULAR | Status: AC
  Administered 2016-10-12: 4 mg via INTRAVENOUS
  Filled 2016-10-12: qty 2

## 2016-10-12 NOTE — ED Triage Notes (Signed)
Per EMS, patient from a parking lot, found in car by [employer] unresponsive, given narcan by fire. Denies IV drug use. Reports taking "a pill for pain from a friend." Ambulatory with EMS.   18 L FA 500 ml NS with EMS  BP 121/89 HR 73 RR 14 O2 95% CBG 245

## 2016-10-12 NOTE — ED Notes (Signed)
Bed: WA25 Expected date:  Expected time:  Means of arrival:  Comments: OD 

## 2016-10-12 NOTE — ED Provider Notes (Signed)
[facility]-EMERGENCY DEPT Provider Note   CSN: [id number] Arrival date & time: 10/12/16  1837     History   Chief Complaint Chief Complaint  Patient presents with  . Drug Overdose   Triage Note 1842 Per EMS, patient from a parking lot, found in car by [employer] unresponsive, given narcan by fire. Denies IV drug use. Reports taking "a pill for pain from a friend." Ambulatory with EMS.     HPI Dylan Hoffman is a 36 y.o. male.  The history is provided by the patient.  Ingestion  This is a new problem. The current episode started 3 to 5 hours ago. Episode frequency: once. The problem has been resolved. Pertinent negatives include no chest pain, no abdominal pain, no headaches and no shortness of breath. Associated symptoms comments: somnolence  . Nothing aggravates the symptoms. Relieved by: Narcan. Treatments tried: Narcan. The treatment provided significant relief.   Patient reports that he take a pill from one of his friends, for pain.  Denies any IV drug use. Does admit to using marijuana and [employer] but has not used in a few days.  Denies any recent fevers, illnesses, infections. Denies any current physical complaints.  Patient adamantly denies any suicidal ideation, homicidal ideation, AVH.  Past Medical History:  Diagnosis Date  . ADHD (attention deficit hyperactivity disorder)    "dx'd as a teen; haven't taked RX in 15 years" (05/12/2013)  . Afib (HCC)   . Anxiety   . Daily headache   . GERD (gastroesophageal reflux disease)   . Gonorrhea   . Kidney stone ~ 2011   "passed it" (05/12/2013)  . Spontaneous pneumothorax ~ 2006    Patient Active Problem List   Diagnosis Date Noted  . Marijuana use 03/10/2014  . Tobacco abuse 03/08/2014  . Cocaine abuse 03/08/2014  . Polysubstance abuse 03/08/2014  . Atrial fibrillation with RVR (HCC) 05/11/2013    Past Surgical History:  Procedure Laterality Date  . PERCUTANEOUS PINNING PHALANX FRACTURE OF HAND Right ~ 2010       Home  Medications    Prior to Admission medications   Medication Sig Start Date End Date Taking? Authorizing Provider  acyclovir (ZOVIRAX) 400 MG tablet Take 1 tablet (400 mg total) by mouth 4 (four) times daily. Patient not taking: Reported on 10/12/2016 04/02/14   Azalia Bilis, MD  aspirin EC 162 MG EC tablet Take 1 tablet (162 mg total) by mouth daily. Patient not taking: Reported on 10/12/2016 03/08/14   Azalee Course, PA  buPROPion [facility] SR) 150 MG 12 hr tablet Take 1 tablet (150 mg total) by mouth 2 (two) times daily. Take once daily for 3 days, then twice daily, 8 hrs apart, last dose before 6 pm Patient not taking: Reported on 10/12/2016 03/10/14   Dessa Phi, MD  diltiazem (CARDIZEM CD) 240 MG 24 hr capsule Take 1 capsule (240 mg total) by mouth daily. Patient not taking: Reported on 10/12/2016 03/09/14   Azalee Course, PA  hydroxypropyl methylcellulose / hypromellose (ISOPTO TEARS / GONIOVISC) 2.5 % ophthalmic solution Place 2 drops into the left eye 4 (four) times daily as needed for dry eyes. Patient not taking: Reported on 10/12/2016 04/02/14   Azalia Bilis, MD  labetalol (NORMODYNE) 200 MG tablet Take 1 tablet (200 mg total) by mouth 2 (two) times daily. Patient not taking: Reported on 10/12/2016 03/08/14   Azalee Course, PA  predniSONE (DELTASONE) 10 MG tablet Take 6 tablets (60 mg total) by mouth daily. Patient not taking: Reported  on 10/12/2016 04/02/14   Azalia Bilis, MD    Family History Family History  Problem Relation Age of Onset  . Glaucoma Mother   . Heart disease Paternal Grandfather     Social History Social History  Substance Use Topics  . Smoking status: Current Every Day Smoker    Packs/day: 0.25    Years: 19.00    Types: Cigarettes  . Smokeless tobacco: Never Used  . Alcohol use Yes     Comment: occassional     Allergies   Patient has no known allergies.   Review of Systems Review of Systems  Respiratory: Negative for shortness of breath.     Cardiovascular: Negative for chest pain.  Gastrointestinal: Negative for abdominal pain.  Neurological: Negative for headaches.  All other systems are reviewed and are negative for acute change except as noted in the HPI    Physical Exam Updated Vital Signs BP 113/85   Pulse 71   Temp 97.5 F (36.4 C)   Resp 10   SpO2 95%   Physical Exam  Constitutional: He is oriented to person, place, and time. He appears well-developed and well-nourished. No distress.  HENT:  Head: Normocephalic and atraumatic.  Nose: Nose normal.  Eyes: Conjunctivae and EOM are normal. Pupils are equal, round, and reactive to light. Right eye exhibits no discharge. Left eye exhibits no discharge. No scleral icterus.  Neck: Normal range of motion. Neck supple.  Cardiovascular: Normal rate and regular rhythm.  Exam reveals no gallop and no friction rub.   No murmur heard. Pulmonary/Chest: Effort normal and breath sounds normal. No stridor. No respiratory distress. He has no rales.  Abdominal: Soft. He exhibits no distension. There is no tenderness.  Musculoskeletal: He exhibits no edema or tenderness.  Neurological: He is alert and oriented to person, place, and time.  Skin: Skin is warm and dry. No rash noted. He is not diaphoretic. No erythema.  Psychiatric: He has a normal mood and affect.  Vitals reviewed.    ED Treatments / Results  Labs (all labs ordered are listed, but only abnormal results are displayed) Labs Reviewed  COMPREHENSIVE METABOLIC PANEL - Abnormal; Notable for the following:       Result Value   Glucose, Bld 128 (*)    All other components within normal limits  ACETAMINOPHEN LEVEL - Abnormal; Notable for the following:    Acetaminophen (Tylenol), Serum <10 (*)    All other components within normal limits  CBC WITH DIFFERENTIAL/PLATELET - Abnormal; Notable for the following:    WBC 11.7 (*)    Neutro Abs 10.3 (*)    All other components within normal limits  CBG MONITORING, ED  - Abnormal; Notable for the following:    Glucose-Capillary 118 (*)    All other components within normal limits  SALICYLATE LEVEL  ETHANOL  RAPID URINE DRUG SCREEN, HOSP PERFORMED    EKG  EKG Interpretation None       Radiology No results found.  Procedures Procedures (including critical care time)  Medications Ordered in ED Medications  ondansetron (ZOFRAN) injection 4 mg (4 mg Intravenous Given 10/12/16 2312)     Initial Impression / Assessment and Plan / ED Course  I have reviewed the triage vital signs and the nursing notes.  Pertinent labs & imaging results that were available during my care of the patient were reviewed by me and considered in my medical decision making (see chart for details).       Likely accidental narcotic  overdose as patient responded well to single dose of Narcan.  Screening labs obtained which were reassuring and unremarkable.  Monitored for 5 hours without additional Narcan required. No respiratory decline for lethargy.  The patient is safe for discharge with strict return precautions.   Final Clinical Impressions(s) / ED Diagnoses   Final diagnoses:  Opiate overdose, accidental or unintentional, initial encounter   Disposition: Discharge  Condition: Good  I have discussed the results, Dx and Tx plan with the patient who expressed understanding and agree(s) with the plan. Discharge instructions discussed at great length. The patient was given strict return precautions who verbalized understanding of the instructions. No further questions at time of discharge.    New Prescriptions   No medications on file    Follow Up: Primary care provider         Nira Connardama, Tanice Petre Eduardo, MD 10/13/16 0005

## 2016-10-12 NOTE — ED Notes (Signed)
Pt has been given a urinal and is aware that we need a urine specimen
